# Patient Record
Sex: Female | Born: 1945
Health system: Southern US, Community
[De-identification: ages and names within clinical notes are randomized; demographics above are authoritative.]

## PROBLEM LIST (undated history)

## (undated) DIAGNOSIS — Z87898 Personal history of other specified conditions: Secondary | ICD-10-CM

## (undated) DIAGNOSIS — K219 Gastro-esophageal reflux disease without esophagitis: Secondary | ICD-10-CM

## (undated) DIAGNOSIS — G4730 Sleep apnea, unspecified: Secondary | ICD-10-CM

## (undated) DIAGNOSIS — R519 Headache, unspecified: Secondary | ICD-10-CM

## (undated) DIAGNOSIS — C801 Malignant (primary) neoplasm, unspecified: Secondary | ICD-10-CM

## (undated) DIAGNOSIS — C4491 Basal cell carcinoma of skin, unspecified: Secondary | ICD-10-CM

## (undated) DIAGNOSIS — M1990 Unspecified osteoarthritis, unspecified site: Secondary | ICD-10-CM

## (undated) DIAGNOSIS — R51 Headache: Secondary | ICD-10-CM

## (undated) DIAGNOSIS — H04123 Dry eye syndrome of bilateral lacrimal glands: Secondary | ICD-10-CM

## (undated) HISTORY — PX: MASTECTOMY: SHX3

## (undated) HISTORY — PX: APPENDECTOMY: SHX54

## (undated) HISTORY — PX: TONSILLECTOMY: SUR1361

## (undated) HISTORY — PX: BREAST SURGERY: SHX581

## (undated) HISTORY — PX: CATARACT EXTRACTION, BILATERAL: SHX1313

## (undated) HISTORY — DX: Basal cell carcinoma of skin, unspecified: C44.91

## (undated) HISTORY — PX: RECONSTRUCTION BREAST W/ TRAM FLAP: SUR1079

---

## 1998-02-05 ENCOUNTER — Other Ambulatory Visit: Admission: RE | Admit: 1998-02-05 | Discharge: 1998-02-05 | Payer: Self-pay | Admitting: Gynecology

## 2000-09-17 ENCOUNTER — Ambulatory Visit (HOSPITAL_COMMUNITY): Admission: RE | Admit: 2000-09-17 | Discharge: 2000-09-17 | Payer: Self-pay | Admitting: Internal Medicine

## 2000-09-17 ENCOUNTER — Encounter: Payer: Self-pay | Admitting: Internal Medicine

## 2002-01-04 ENCOUNTER — Other Ambulatory Visit: Admission: RE | Admit: 2002-01-04 | Discharge: 2002-01-04 | Payer: Self-pay | Admitting: Gynecology

## 2002-12-20 ENCOUNTER — Other Ambulatory Visit: Admission: RE | Admit: 2002-12-20 | Discharge: 2002-12-20 | Payer: Self-pay | Admitting: Gynecology

## 2003-01-03 ENCOUNTER — Encounter: Payer: Self-pay | Admitting: Internal Medicine

## 2003-01-03 ENCOUNTER — Encounter: Admission: RE | Admit: 2003-01-03 | Discharge: 2003-01-03 | Payer: Self-pay | Admitting: Internal Medicine

## 2004-01-01 ENCOUNTER — Other Ambulatory Visit: Admission: RE | Admit: 2004-01-01 | Discharge: 2004-01-01 | Payer: Self-pay | Admitting: Gynecology

## 2005-01-22 ENCOUNTER — Other Ambulatory Visit: Admission: RE | Admit: 2005-01-22 | Discharge: 2005-01-22 | Payer: Self-pay | Admitting: Gynecology

## 2006-01-22 ENCOUNTER — Other Ambulatory Visit: Admission: RE | Admit: 2006-01-22 | Discharge: 2006-01-22 | Payer: Self-pay | Admitting: Gynecology

## 2007-01-25 ENCOUNTER — Other Ambulatory Visit: Admission: RE | Admit: 2007-01-25 | Discharge: 2007-01-25 | Payer: Self-pay | Admitting: Gynecology

## 2008-01-31 ENCOUNTER — Other Ambulatory Visit: Admission: RE | Admit: 2008-01-31 | Discharge: 2008-01-31 | Payer: Self-pay | Admitting: Gynecology

## 2012-03-18 DIAGNOSIS — H9209 Otalgia, unspecified ear: Secondary | ICD-10-CM | POA: Diagnosis not present

## 2012-03-18 DIAGNOSIS — M79609 Pain in unspecified limb: Secondary | ICD-10-CM | POA: Diagnosis not present

## 2012-03-18 DIAGNOSIS — R059 Cough, unspecified: Secondary | ICD-10-CM | POA: Diagnosis not present

## 2012-03-18 DIAGNOSIS — R05 Cough: Secondary | ICD-10-CM | POA: Diagnosis not present

## 2012-03-18 DIAGNOSIS — M25569 Pain in unspecified knee: Secondary | ICD-10-CM | POA: Diagnosis not present

## 2012-03-22 DIAGNOSIS — M779 Enthesopathy, unspecified: Secondary | ICD-10-CM | POA: Diagnosis not present

## 2012-03-25 DIAGNOSIS — M7500 Adhesive capsulitis of unspecified shoulder: Secondary | ICD-10-CM | POA: Diagnosis not present

## 2012-03-29 DIAGNOSIS — M7500 Adhesive capsulitis of unspecified shoulder: Secondary | ICD-10-CM | POA: Diagnosis not present

## 2012-04-01 DIAGNOSIS — M7500 Adhesive capsulitis of unspecified shoulder: Secondary | ICD-10-CM | POA: Diagnosis not present

## 2012-04-05 DIAGNOSIS — E7800 Pure hypercholesterolemia, unspecified: Secondary | ICD-10-CM | POA: Diagnosis not present

## 2012-04-05 DIAGNOSIS — M7500 Adhesive capsulitis of unspecified shoulder: Secondary | ICD-10-CM | POA: Diagnosis not present

## 2012-04-05 DIAGNOSIS — Z1331 Encounter for screening for depression: Secondary | ICD-10-CM | POA: Diagnosis not present

## 2012-04-05 DIAGNOSIS — Z79899 Other long term (current) drug therapy: Secondary | ICD-10-CM | POA: Diagnosis not present

## 2012-04-05 DIAGNOSIS — R5383 Other fatigue: Secondary | ICD-10-CM | POA: Diagnosis not present

## 2012-04-05 DIAGNOSIS — R5381 Other malaise: Secondary | ICD-10-CM | POA: Diagnosis not present

## 2012-04-05 DIAGNOSIS — Z23 Encounter for immunization: Secondary | ICD-10-CM | POA: Diagnosis not present

## 2012-04-05 DIAGNOSIS — Z0000 Encounter for general adult medical examination without abnormal findings: Secondary | ICD-10-CM | POA: Diagnosis not present

## 2012-04-05 DIAGNOSIS — G479 Sleep disorder, unspecified: Secondary | ICD-10-CM | POA: Diagnosis not present

## 2012-04-08 DIAGNOSIS — M7500 Adhesive capsulitis of unspecified shoulder: Secondary | ICD-10-CM | POA: Diagnosis not present

## 2012-04-12 DIAGNOSIS — G518 Other disorders of facial nerve: Secondary | ICD-10-CM | POA: Diagnosis not present

## 2012-04-12 DIAGNOSIS — G43719 Chronic migraine without aura, intractable, without status migrainosus: Secondary | ICD-10-CM | POA: Diagnosis not present

## 2012-04-12 DIAGNOSIS — IMO0001 Reserved for inherently not codable concepts without codable children: Secondary | ICD-10-CM | POA: Diagnosis not present

## 2012-04-12 DIAGNOSIS — M542 Cervicalgia: Secondary | ICD-10-CM | POA: Diagnosis not present

## 2012-04-12 DIAGNOSIS — Z79899 Other long term (current) drug therapy: Secondary | ICD-10-CM | POA: Diagnosis not present

## 2012-04-12 DIAGNOSIS — M7500 Adhesive capsulitis of unspecified shoulder: Secondary | ICD-10-CM | POA: Diagnosis not present

## 2012-04-12 DIAGNOSIS — E559 Vitamin D deficiency, unspecified: Secondary | ICD-10-CM | POA: Diagnosis not present

## 2012-04-19 DIAGNOSIS — M779 Enthesopathy, unspecified: Secondary | ICD-10-CM | POA: Diagnosis not present

## 2012-04-20 DIAGNOSIS — M7500 Adhesive capsulitis of unspecified shoulder: Secondary | ICD-10-CM | POA: Diagnosis not present

## 2012-04-22 DIAGNOSIS — R51 Headache: Secondary | ICD-10-CM | POA: Diagnosis not present

## 2012-04-22 DIAGNOSIS — IMO0001 Reserved for inherently not codable concepts without codable children: Secondary | ICD-10-CM | POA: Diagnosis not present

## 2012-04-22 DIAGNOSIS — G518 Other disorders of facial nerve: Secondary | ICD-10-CM | POA: Diagnosis not present

## 2012-04-22 DIAGNOSIS — M7500 Adhesive capsulitis of unspecified shoulder: Secondary | ICD-10-CM | POA: Diagnosis not present

## 2012-04-22 DIAGNOSIS — M542 Cervicalgia: Secondary | ICD-10-CM | POA: Diagnosis not present

## 2012-05-10 DIAGNOSIS — IMO0001 Reserved for inherently not codable concepts without codable children: Secondary | ICD-10-CM | POA: Diagnosis not present

## 2012-05-10 DIAGNOSIS — R51 Headache: Secondary | ICD-10-CM | POA: Diagnosis not present

## 2012-05-10 DIAGNOSIS — M542 Cervicalgia: Secondary | ICD-10-CM | POA: Diagnosis not present

## 2012-05-10 DIAGNOSIS — G518 Other disorders of facial nerve: Secondary | ICD-10-CM | POA: Diagnosis not present

## 2012-05-11 DIAGNOSIS — M7500 Adhesive capsulitis of unspecified shoulder: Secondary | ICD-10-CM | POA: Diagnosis not present

## 2012-05-14 DIAGNOSIS — M7500 Adhesive capsulitis of unspecified shoulder: Secondary | ICD-10-CM | POA: Diagnosis not present

## 2012-05-17 DIAGNOSIS — M779 Enthesopathy, unspecified: Secondary | ICD-10-CM | POA: Diagnosis not present

## 2012-05-17 DIAGNOSIS — G4733 Obstructive sleep apnea (adult) (pediatric): Secondary | ICD-10-CM | POA: Diagnosis not present

## 2012-05-24 DIAGNOSIS — G518 Other disorders of facial nerve: Secondary | ICD-10-CM | POA: Diagnosis not present

## 2012-05-24 DIAGNOSIS — IMO0001 Reserved for inherently not codable concepts without codable children: Secondary | ICD-10-CM | POA: Diagnosis not present

## 2012-05-24 DIAGNOSIS — R51 Headache: Secondary | ICD-10-CM | POA: Diagnosis not present

## 2012-05-24 DIAGNOSIS — M542 Cervicalgia: Secondary | ICD-10-CM | POA: Diagnosis not present

## 2012-06-01 DIAGNOSIS — H2510 Age-related nuclear cataract, unspecified eye: Secondary | ICD-10-CM | POA: Diagnosis not present

## 2012-06-01 DIAGNOSIS — H04129 Dry eye syndrome of unspecified lacrimal gland: Secondary | ICD-10-CM | POA: Diagnosis not present

## 2012-07-05 DIAGNOSIS — G43719 Chronic migraine without aura, intractable, without status migrainosus: Secondary | ICD-10-CM | POA: Diagnosis not present

## 2012-08-09 DIAGNOSIS — M7500 Adhesive capsulitis of unspecified shoulder: Secondary | ICD-10-CM | POA: Diagnosis not present

## 2012-08-12 DIAGNOSIS — M7500 Adhesive capsulitis of unspecified shoulder: Secondary | ICD-10-CM | POA: Diagnosis not present

## 2012-08-16 DIAGNOSIS — M7500 Adhesive capsulitis of unspecified shoulder: Secondary | ICD-10-CM | POA: Diagnosis not present

## 2012-08-18 DIAGNOSIS — G4733 Obstructive sleep apnea (adult) (pediatric): Secondary | ICD-10-CM | POA: Diagnosis not present

## 2012-08-19 DIAGNOSIS — M7500 Adhesive capsulitis of unspecified shoulder: Secondary | ICD-10-CM | POA: Diagnosis not present

## 2012-08-23 DIAGNOSIS — M7500 Adhesive capsulitis of unspecified shoulder: Secondary | ICD-10-CM | POA: Diagnosis not present

## 2012-08-25 DIAGNOSIS — G4733 Obstructive sleep apnea (adult) (pediatric): Secondary | ICD-10-CM | POA: Diagnosis not present

## 2012-08-30 DIAGNOSIS — M7500 Adhesive capsulitis of unspecified shoulder: Secondary | ICD-10-CM | POA: Diagnosis not present

## 2012-09-02 DIAGNOSIS — M7500 Adhesive capsulitis of unspecified shoulder: Secondary | ICD-10-CM | POA: Diagnosis not present

## 2012-09-06 DIAGNOSIS — M7500 Adhesive capsulitis of unspecified shoulder: Secondary | ICD-10-CM | POA: Diagnosis not present

## 2012-09-08 DIAGNOSIS — M779 Enthesopathy, unspecified: Secondary | ICD-10-CM | POA: Diagnosis not present

## 2012-09-09 DIAGNOSIS — M7500 Adhesive capsulitis of unspecified shoulder: Secondary | ICD-10-CM | POA: Diagnosis not present

## 2012-09-14 DIAGNOSIS — M7500 Adhesive capsulitis of unspecified shoulder: Secondary | ICD-10-CM | POA: Diagnosis not present

## 2012-09-16 DIAGNOSIS — M7500 Adhesive capsulitis of unspecified shoulder: Secondary | ICD-10-CM | POA: Diagnosis not present

## 2012-09-20 DIAGNOSIS — L608 Other nail disorders: Secondary | ICD-10-CM | POA: Diagnosis not present

## 2012-09-20 DIAGNOSIS — M7500 Adhesive capsulitis of unspecified shoulder: Secondary | ICD-10-CM | POA: Diagnosis not present

## 2012-09-20 DIAGNOSIS — K130 Diseases of lips: Secondary | ICD-10-CM | POA: Diagnosis not present

## 2012-09-23 DIAGNOSIS — M7500 Adhesive capsulitis of unspecified shoulder: Secondary | ICD-10-CM | POA: Diagnosis not present

## 2012-09-27 DIAGNOSIS — M7500 Adhesive capsulitis of unspecified shoulder: Secondary | ICD-10-CM | POA: Diagnosis not present

## 2012-09-30 DIAGNOSIS — M7500 Adhesive capsulitis of unspecified shoulder: Secondary | ICD-10-CM | POA: Diagnosis not present

## 2012-10-04 DIAGNOSIS — M7500 Adhesive capsulitis of unspecified shoulder: Secondary | ICD-10-CM | POA: Diagnosis not present

## 2012-10-07 DIAGNOSIS — G43719 Chronic migraine without aura, intractable, without status migrainosus: Secondary | ICD-10-CM | POA: Diagnosis not present

## 2012-10-11 DIAGNOSIS — M779 Enthesopathy, unspecified: Secondary | ICD-10-CM | POA: Diagnosis not present

## 2012-10-21 DIAGNOSIS — M7500 Adhesive capsulitis of unspecified shoulder: Secondary | ICD-10-CM | POA: Diagnosis not present

## 2012-10-25 DIAGNOSIS — M7500 Adhesive capsulitis of unspecified shoulder: Secondary | ICD-10-CM | POA: Diagnosis not present

## 2012-11-04 DIAGNOSIS — G4733 Obstructive sleep apnea (adult) (pediatric): Secondary | ICD-10-CM | POA: Diagnosis not present

## 2012-11-08 DIAGNOSIS — R350 Frequency of micturition: Secondary | ICD-10-CM | POA: Diagnosis not present

## 2013-01-31 DIAGNOSIS — R3915 Urgency of urination: Secondary | ICD-10-CM | POA: Diagnosis not present

## 2013-01-31 DIAGNOSIS — R350 Frequency of micturition: Secondary | ICD-10-CM | POA: Diagnosis not present

## 2013-02-07 DIAGNOSIS — Z124 Encounter for screening for malignant neoplasm of cervix: Secondary | ICD-10-CM | POA: Diagnosis not present

## 2013-02-07 DIAGNOSIS — Z01419 Encounter for gynecological examination (general) (routine) without abnormal findings: Secondary | ICD-10-CM | POA: Diagnosis not present

## 2013-02-09 DIAGNOSIS — R3915 Urgency of urination: Secondary | ICD-10-CM | POA: Diagnosis not present

## 2013-02-09 DIAGNOSIS — M62838 Other muscle spasm: Secondary | ICD-10-CM | POA: Diagnosis not present

## 2013-02-09 DIAGNOSIS — R279 Unspecified lack of coordination: Secondary | ICD-10-CM | POA: Diagnosis not present

## 2013-02-09 DIAGNOSIS — G4733 Obstructive sleep apnea (adult) (pediatric): Secondary | ICD-10-CM | POA: Diagnosis not present

## 2013-02-09 DIAGNOSIS — R350 Frequency of micturition: Secondary | ICD-10-CM | POA: Diagnosis not present

## 2013-02-28 DIAGNOSIS — L570 Actinic keratosis: Secondary | ICD-10-CM | POA: Diagnosis not present

## 2013-02-28 DIAGNOSIS — R350 Frequency of micturition: Secondary | ICD-10-CM | POA: Diagnosis not present

## 2013-02-28 DIAGNOSIS — M62838 Other muscle spasm: Secondary | ICD-10-CM | POA: Diagnosis not present

## 2013-02-28 DIAGNOSIS — D239 Other benign neoplasm of skin, unspecified: Secondary | ICD-10-CM | POA: Diagnosis not present

## 2013-02-28 DIAGNOSIS — R279 Unspecified lack of coordination: Secondary | ICD-10-CM | POA: Diagnosis not present

## 2013-02-28 DIAGNOSIS — R3915 Urgency of urination: Secondary | ICD-10-CM | POA: Diagnosis not present

## 2013-03-02 DIAGNOSIS — Z9010 Acquired absence of unspecified breast and nipple: Secondary | ICD-10-CM | POA: Diagnosis not present

## 2013-03-02 DIAGNOSIS — Z853 Personal history of malignant neoplasm of breast: Secondary | ICD-10-CM | POA: Diagnosis not present

## 2013-04-11 DIAGNOSIS — G43719 Chronic migraine without aura, intractable, without status migrainosus: Secondary | ICD-10-CM | POA: Diagnosis not present

## 2013-04-20 DIAGNOSIS — Z23 Encounter for immunization: Secondary | ICD-10-CM | POA: Diagnosis not present

## 2013-04-22 DIAGNOSIS — Z0000 Encounter for general adult medical examination without abnormal findings: Secondary | ICD-10-CM | POA: Diagnosis not present

## 2013-04-22 DIAGNOSIS — E559 Vitamin D deficiency, unspecified: Secondary | ICD-10-CM | POA: Diagnosis not present

## 2013-04-22 DIAGNOSIS — Z79899 Other long term (current) drug therapy: Secondary | ICD-10-CM | POA: Diagnosis not present

## 2013-04-22 DIAGNOSIS — G4733 Obstructive sleep apnea (adult) (pediatric): Secondary | ICD-10-CM | POA: Diagnosis not present

## 2013-04-22 DIAGNOSIS — M899 Disorder of bone, unspecified: Secondary | ICD-10-CM | POA: Diagnosis not present

## 2013-04-22 DIAGNOSIS — G43909 Migraine, unspecified, not intractable, without status migrainosus: Secondary | ICD-10-CM | POA: Diagnosis not present

## 2013-04-22 DIAGNOSIS — Z1331 Encounter for screening for depression: Secondary | ICD-10-CM | POA: Diagnosis not present

## 2013-04-22 DIAGNOSIS — E7800 Pure hypercholesterolemia, unspecified: Secondary | ICD-10-CM | POA: Diagnosis not present

## 2013-05-12 DIAGNOSIS — M899 Disorder of bone, unspecified: Secondary | ICD-10-CM | POA: Diagnosis not present

## 2013-06-14 DIAGNOSIS — H2510 Age-related nuclear cataract, unspecified eye: Secondary | ICD-10-CM | POA: Diagnosis not present

## 2013-09-20 DIAGNOSIS — L570 Actinic keratosis: Secondary | ICD-10-CM | POA: Diagnosis not present

## 2013-09-20 DIAGNOSIS — L821 Other seborrheic keratosis: Secondary | ICD-10-CM | POA: Diagnosis not present

## 2013-09-20 DIAGNOSIS — D485 Neoplasm of uncertain behavior of skin: Secondary | ICD-10-CM | POA: Diagnosis not present

## 2013-10-10 DIAGNOSIS — G43719 Chronic migraine without aura, intractable, without status migrainosus: Secondary | ICD-10-CM | POA: Diagnosis not present

## 2013-11-24 DIAGNOSIS — M949 Disorder of cartilage, unspecified: Secondary | ICD-10-CM | POA: Diagnosis not present

## 2013-11-24 DIAGNOSIS — M899 Disorder of bone, unspecified: Secondary | ICD-10-CM | POA: Diagnosis not present

## 2013-11-24 DIAGNOSIS — R059 Cough, unspecified: Secondary | ICD-10-CM | POA: Diagnosis not present

## 2013-11-24 DIAGNOSIS — R05 Cough: Secondary | ICD-10-CM | POA: Diagnosis not present

## 2013-12-06 DIAGNOSIS — R05 Cough: Secondary | ICD-10-CM | POA: Diagnosis not present

## 2013-12-06 DIAGNOSIS — R059 Cough, unspecified: Secondary | ICD-10-CM | POA: Diagnosis not present

## 2013-12-06 DIAGNOSIS — J370 Chronic laryngitis: Secondary | ICD-10-CM | POA: Diagnosis not present

## 2013-12-06 DIAGNOSIS — J309 Allergic rhinitis, unspecified: Secondary | ICD-10-CM | POA: Diagnosis not present

## 2013-12-06 DIAGNOSIS — J45909 Unspecified asthma, uncomplicated: Secondary | ICD-10-CM | POA: Diagnosis not present

## 2013-12-13 DIAGNOSIS — R350 Frequency of micturition: Secondary | ICD-10-CM | POA: Diagnosis not present

## 2014-01-02 DIAGNOSIS — R350 Frequency of micturition: Secondary | ICD-10-CM | POA: Diagnosis not present

## 2014-01-02 DIAGNOSIS — R3915 Urgency of urination: Secondary | ICD-10-CM | POA: Diagnosis not present

## 2014-01-09 DIAGNOSIS — R3915 Urgency of urination: Secondary | ICD-10-CM | POA: Diagnosis not present

## 2014-01-09 DIAGNOSIS — R350 Frequency of micturition: Secondary | ICD-10-CM | POA: Diagnosis not present

## 2014-01-16 DIAGNOSIS — R3915 Urgency of urination: Secondary | ICD-10-CM | POA: Diagnosis not present

## 2014-01-23 DIAGNOSIS — R3915 Urgency of urination: Secondary | ICD-10-CM | POA: Diagnosis not present

## 2014-02-06 DIAGNOSIS — R3915 Urgency of urination: Secondary | ICD-10-CM | POA: Diagnosis not present

## 2014-02-08 DIAGNOSIS — Z01419 Encounter for gynecological examination (general) (routine) without abnormal findings: Secondary | ICD-10-CM | POA: Diagnosis not present

## 2014-02-08 DIAGNOSIS — Z1212 Encounter for screening for malignant neoplasm of rectum: Secondary | ICD-10-CM | POA: Diagnosis not present

## 2014-02-13 DIAGNOSIS — R3915 Urgency of urination: Secondary | ICD-10-CM | POA: Diagnosis not present

## 2014-02-13 DIAGNOSIS — R350 Frequency of micturition: Secondary | ICD-10-CM | POA: Diagnosis not present

## 2014-02-20 DIAGNOSIS — R3915 Urgency of urination: Secondary | ICD-10-CM | POA: Diagnosis not present

## 2014-02-27 DIAGNOSIS — R350 Frequency of micturition: Secondary | ICD-10-CM | POA: Diagnosis not present

## 2014-02-27 DIAGNOSIS — R3915 Urgency of urination: Secondary | ICD-10-CM | POA: Diagnosis not present

## 2014-03-06 DIAGNOSIS — R3915 Urgency of urination: Secondary | ICD-10-CM | POA: Diagnosis not present

## 2014-03-06 DIAGNOSIS — R350 Frequency of micturition: Secondary | ICD-10-CM | POA: Diagnosis not present

## 2014-03-07 DIAGNOSIS — J370 Chronic laryngitis: Secondary | ICD-10-CM | POA: Diagnosis not present

## 2014-03-07 DIAGNOSIS — J309 Allergic rhinitis, unspecified: Secondary | ICD-10-CM | POA: Diagnosis not present

## 2014-03-07 DIAGNOSIS — J45909 Unspecified asthma, uncomplicated: Secondary | ICD-10-CM | POA: Diagnosis not present

## 2014-03-08 DIAGNOSIS — Z853 Personal history of malignant neoplasm of breast: Secondary | ICD-10-CM | POA: Diagnosis not present

## 2014-03-08 DIAGNOSIS — Z1231 Encounter for screening mammogram for malignant neoplasm of breast: Secondary | ICD-10-CM | POA: Diagnosis not present

## 2014-03-14 DIAGNOSIS — R350 Frequency of micturition: Secondary | ICD-10-CM | POA: Diagnosis not present

## 2014-03-14 DIAGNOSIS — R3915 Urgency of urination: Secondary | ICD-10-CM | POA: Diagnosis not present

## 2014-03-21 DIAGNOSIS — R3915 Urgency of urination: Secondary | ICD-10-CM | POA: Diagnosis not present

## 2014-03-21 DIAGNOSIS — R350 Frequency of micturition: Secondary | ICD-10-CM | POA: Diagnosis not present

## 2014-03-24 DIAGNOSIS — G43719 Chronic migraine without aura, intractable, without status migrainosus: Secondary | ICD-10-CM | POA: Diagnosis not present

## 2014-03-24 DIAGNOSIS — Z79899 Other long term (current) drug therapy: Secondary | ICD-10-CM | POA: Diagnosis not present

## 2014-03-27 DIAGNOSIS — R3915 Urgency of urination: Secondary | ICD-10-CM | POA: Diagnosis not present

## 2014-03-27 DIAGNOSIS — R350 Frequency of micturition: Secondary | ICD-10-CM | POA: Diagnosis not present

## 2014-04-21 DIAGNOSIS — Z23 Encounter for immunization: Secondary | ICD-10-CM | POA: Diagnosis not present

## 2014-04-24 DIAGNOSIS — R350 Frequency of micturition: Secondary | ICD-10-CM | POA: Diagnosis not present

## 2014-04-24 DIAGNOSIS — R3915 Urgency of urination: Secondary | ICD-10-CM | POA: Diagnosis not present

## 2014-05-15 DIAGNOSIS — R3915 Urgency of urination: Secondary | ICD-10-CM | POA: Diagnosis not present

## 2014-05-22 DIAGNOSIS — Z23 Encounter for immunization: Secondary | ICD-10-CM | POA: Diagnosis not present

## 2014-05-22 DIAGNOSIS — G4733 Obstructive sleep apnea (adult) (pediatric): Secondary | ICD-10-CM | POA: Diagnosis not present

## 2014-05-22 DIAGNOSIS — E559 Vitamin D deficiency, unspecified: Secondary | ICD-10-CM | POA: Diagnosis not present

## 2014-05-22 DIAGNOSIS — Z1389 Encounter for screening for other disorder: Secondary | ICD-10-CM | POA: Diagnosis not present

## 2014-05-22 DIAGNOSIS — Z0000 Encounter for general adult medical examination without abnormal findings: Secondary | ICD-10-CM | POA: Diagnosis not present

## 2014-05-22 DIAGNOSIS — M8580 Other specified disorders of bone density and structure, unspecified site: Secondary | ICD-10-CM | POA: Diagnosis not present

## 2014-05-22 DIAGNOSIS — E780 Pure hypercholesterolemia: Secondary | ICD-10-CM | POA: Diagnosis not present

## 2014-06-05 DIAGNOSIS — R3915 Urgency of urination: Secondary | ICD-10-CM | POA: Diagnosis not present

## 2014-06-16 DIAGNOSIS — H2511 Age-related nuclear cataract, right eye: Secondary | ICD-10-CM | POA: Diagnosis not present

## 2014-06-16 DIAGNOSIS — H02839 Dermatochalasis of unspecified eye, unspecified eyelid: Secondary | ICD-10-CM | POA: Diagnosis not present

## 2014-06-16 DIAGNOSIS — H2512 Age-related nuclear cataract, left eye: Secondary | ICD-10-CM | POA: Diagnosis not present

## 2014-06-16 DIAGNOSIS — H04123 Dry eye syndrome of bilateral lacrimal glands: Secondary | ICD-10-CM | POA: Diagnosis not present

## 2014-06-16 DIAGNOSIS — H3531 Nonexudative age-related macular degeneration: Secondary | ICD-10-CM | POA: Diagnosis not present

## 2014-06-28 DIAGNOSIS — R350 Frequency of micturition: Secondary | ICD-10-CM | POA: Diagnosis not present

## 2014-06-28 DIAGNOSIS — R3915 Urgency of urination: Secondary | ICD-10-CM | POA: Diagnosis not present

## 2014-07-26 DIAGNOSIS — R350 Frequency of micturition: Secondary | ICD-10-CM | POA: Diagnosis not present

## 2014-07-26 DIAGNOSIS — R3915 Urgency of urination: Secondary | ICD-10-CM | POA: Diagnosis not present

## 2014-08-11 DIAGNOSIS — J309 Allergic rhinitis, unspecified: Secondary | ICD-10-CM | POA: Diagnosis not present

## 2014-08-11 DIAGNOSIS — R05 Cough: Secondary | ICD-10-CM | POA: Diagnosis not present

## 2014-08-11 DIAGNOSIS — J370 Chronic laryngitis: Secondary | ICD-10-CM | POA: Diagnosis not present

## 2014-08-16 DIAGNOSIS — R3915 Urgency of urination: Secondary | ICD-10-CM | POA: Diagnosis not present

## 2014-08-16 DIAGNOSIS — R350 Frequency of micturition: Secondary | ICD-10-CM | POA: Diagnosis not present

## 2014-08-21 DIAGNOSIS — H25811 Combined forms of age-related cataract, right eye: Secondary | ICD-10-CM | POA: Diagnosis not present

## 2014-08-21 DIAGNOSIS — H2511 Age-related nuclear cataract, right eye: Secondary | ICD-10-CM | POA: Diagnosis not present

## 2014-08-22 DIAGNOSIS — H2512 Age-related nuclear cataract, left eye: Secondary | ICD-10-CM | POA: Diagnosis not present

## 2014-09-11 DIAGNOSIS — H2512 Age-related nuclear cataract, left eye: Secondary | ICD-10-CM | POA: Diagnosis not present

## 2014-09-11 DIAGNOSIS — H25812 Combined forms of age-related cataract, left eye: Secondary | ICD-10-CM | POA: Diagnosis not present

## 2014-09-13 DIAGNOSIS — R3915 Urgency of urination: Secondary | ICD-10-CM | POA: Diagnosis not present

## 2014-09-21 DIAGNOSIS — G43719 Chronic migraine without aura, intractable, without status migrainosus: Secondary | ICD-10-CM | POA: Diagnosis not present

## 2014-10-11 DIAGNOSIS — R3915 Urgency of urination: Secondary | ICD-10-CM | POA: Diagnosis not present

## 2014-10-11 DIAGNOSIS — R350 Frequency of micturition: Secondary | ICD-10-CM | POA: Diagnosis not present

## 2014-10-11 DIAGNOSIS — D239 Other benign neoplasm of skin, unspecified: Secondary | ICD-10-CM | POA: Diagnosis not present

## 2014-10-11 DIAGNOSIS — L821 Other seborrheic keratosis: Secondary | ICD-10-CM | POA: Diagnosis not present

## 2014-11-01 DIAGNOSIS — R350 Frequency of micturition: Secondary | ICD-10-CM | POA: Diagnosis not present

## 2014-11-01 DIAGNOSIS — R3915 Urgency of urination: Secondary | ICD-10-CM | POA: Diagnosis not present

## 2014-11-22 DIAGNOSIS — R350 Frequency of micturition: Secondary | ICD-10-CM | POA: Diagnosis not present

## 2014-11-22 DIAGNOSIS — R3915 Urgency of urination: Secondary | ICD-10-CM | POA: Diagnosis not present

## 2014-12-13 DIAGNOSIS — R350 Frequency of micturition: Secondary | ICD-10-CM | POA: Diagnosis not present

## 2014-12-13 DIAGNOSIS — R3915 Urgency of urination: Secondary | ICD-10-CM | POA: Diagnosis not present

## 2015-01-03 DIAGNOSIS — R3915 Urgency of urination: Secondary | ICD-10-CM | POA: Diagnosis not present

## 2015-01-03 DIAGNOSIS — R350 Frequency of micturition: Secondary | ICD-10-CM | POA: Diagnosis not present

## 2015-01-05 DIAGNOSIS — R350 Frequency of micturition: Secondary | ICD-10-CM | POA: Diagnosis not present

## 2015-01-29 DIAGNOSIS — R42 Dizziness and giddiness: Secondary | ICD-10-CM | POA: Diagnosis not present

## 2015-01-29 DIAGNOSIS — K219 Gastro-esophageal reflux disease without esophagitis: Secondary | ICD-10-CM | POA: Diagnosis not present

## 2015-01-31 DIAGNOSIS — R3915 Urgency of urination: Secondary | ICD-10-CM | POA: Diagnosis not present

## 2015-02-26 DIAGNOSIS — Z6824 Body mass index (BMI) 24.0-24.9, adult: Secondary | ICD-10-CM | POA: Diagnosis not present

## 2015-02-26 DIAGNOSIS — Z124 Encounter for screening for malignant neoplasm of cervix: Secondary | ICD-10-CM | POA: Diagnosis not present

## 2015-02-26 DIAGNOSIS — Z1212 Encounter for screening for malignant neoplasm of rectum: Secondary | ICD-10-CM | POA: Diagnosis not present

## 2015-02-27 DIAGNOSIS — R3915 Urgency of urination: Secondary | ICD-10-CM | POA: Diagnosis not present

## 2015-02-27 DIAGNOSIS — R350 Frequency of micturition: Secondary | ICD-10-CM | POA: Diagnosis not present

## 2015-03-12 DIAGNOSIS — Z1231 Encounter for screening mammogram for malignant neoplasm of breast: Secondary | ICD-10-CM | POA: Diagnosis not present

## 2015-03-21 DIAGNOSIS — R350 Frequency of micturition: Secondary | ICD-10-CM | POA: Diagnosis not present

## 2015-03-21 DIAGNOSIS — R3915 Urgency of urination: Secondary | ICD-10-CM | POA: Diagnosis not present

## 2015-04-09 DIAGNOSIS — Z79899 Other long term (current) drug therapy: Secondary | ICD-10-CM | POA: Diagnosis not present

## 2015-04-09 DIAGNOSIS — R51 Headache: Secondary | ICD-10-CM | POA: Diagnosis not present

## 2015-04-09 DIAGNOSIS — G43719 Chronic migraine without aura, intractable, without status migrainosus: Secondary | ICD-10-CM | POA: Diagnosis not present

## 2015-04-11 DIAGNOSIS — R350 Frequency of micturition: Secondary | ICD-10-CM | POA: Diagnosis not present

## 2015-04-11 DIAGNOSIS — R3915 Urgency of urination: Secondary | ICD-10-CM | POA: Diagnosis not present

## 2015-05-01 DIAGNOSIS — Z23 Encounter for immunization: Secondary | ICD-10-CM | POA: Diagnosis not present

## 2015-05-02 DIAGNOSIS — R350 Frequency of micturition: Secondary | ICD-10-CM | POA: Diagnosis not present

## 2015-05-02 DIAGNOSIS — R3915 Urgency of urination: Secondary | ICD-10-CM | POA: Diagnosis not present

## 2015-05-08 DIAGNOSIS — H353133 Nonexudative age-related macular degeneration, bilateral, advanced atrophic without subfoveal involvement: Secondary | ICD-10-CM | POA: Diagnosis not present

## 2015-05-08 DIAGNOSIS — H18413 Arcus senilis, bilateral: Secondary | ICD-10-CM | POA: Diagnosis not present

## 2015-05-08 DIAGNOSIS — H04123 Dry eye syndrome of bilateral lacrimal glands: Secondary | ICD-10-CM | POA: Diagnosis not present

## 2015-05-08 DIAGNOSIS — H02839 Dermatochalasis of unspecified eye, unspecified eyelid: Secondary | ICD-10-CM | POA: Diagnosis not present

## 2015-05-23 DIAGNOSIS — R3915 Urgency of urination: Secondary | ICD-10-CM | POA: Diagnosis not present

## 2015-05-28 ENCOUNTER — Ambulatory Visit
Admission: RE | Admit: 2015-05-28 | Discharge: 2015-05-28 | Disposition: A | Discharge status: Home / Self Care | Payer: Medicare Other | Source: Ambulatory Visit | Attending: Geriatric Medicine | Admitting: Geriatric Medicine

## 2015-05-28 ENCOUNTER — Other Ambulatory Visit: Payer: Self-pay | Admitting: Geriatric Medicine

## 2015-05-28 DIAGNOSIS — Z1389 Encounter for screening for other disorder: Secondary | ICD-10-CM | POA: Diagnosis not present

## 2015-05-28 DIAGNOSIS — R05 Cough: Secondary | ICD-10-CM

## 2015-05-28 DIAGNOSIS — G4700 Insomnia, unspecified: Secondary | ICD-10-CM | POA: Diagnosis not present

## 2015-05-28 DIAGNOSIS — J309 Allergic rhinitis, unspecified: Secondary | ICD-10-CM | POA: Diagnosis not present

## 2015-05-28 DIAGNOSIS — K219 Gastro-esophageal reflux disease without esophagitis: Secondary | ICD-10-CM | POA: Diagnosis not present

## 2015-05-28 DIAGNOSIS — R059 Cough, unspecified: Secondary | ICD-10-CM

## 2015-05-28 DIAGNOSIS — R300 Dysuria: Secondary | ICD-10-CM | POA: Diagnosis not present

## 2015-05-28 DIAGNOSIS — Z0000 Encounter for general adult medical examination without abnormal findings: Secondary | ICD-10-CM | POA: Diagnosis not present

## 2015-05-28 DIAGNOSIS — R197 Diarrhea, unspecified: Secondary | ICD-10-CM | POA: Diagnosis not present

## 2015-06-05 DIAGNOSIS — G4733 Obstructive sleep apnea (adult) (pediatric): Secondary | ICD-10-CM | POA: Diagnosis not present

## 2015-06-19 DIAGNOSIS — R3915 Urgency of urination: Secondary | ICD-10-CM | POA: Diagnosis not present

## 2015-07-17 DIAGNOSIS — R350 Frequency of micturition: Secondary | ICD-10-CM | POA: Diagnosis not present

## 2015-07-17 DIAGNOSIS — R3915 Urgency of urination: Secondary | ICD-10-CM | POA: Diagnosis not present

## 2015-08-07 DIAGNOSIS — R350 Frequency of micturition: Secondary | ICD-10-CM | POA: Diagnosis not present

## 2015-08-07 DIAGNOSIS — R3915 Urgency of urination: Secondary | ICD-10-CM | POA: Diagnosis not present

## 2015-09-04 DIAGNOSIS — R350 Frequency of micturition: Secondary | ICD-10-CM | POA: Diagnosis not present

## 2015-09-04 DIAGNOSIS — R3915 Urgency of urination: Secondary | ICD-10-CM | POA: Diagnosis not present

## 2015-09-27 DIAGNOSIS — R3915 Urgency of urination: Secondary | ICD-10-CM | POA: Diagnosis not present

## 2015-09-27 DIAGNOSIS — R350 Frequency of micturition: Secondary | ICD-10-CM | POA: Diagnosis not present

## 2015-10-16 DIAGNOSIS — G43019 Migraine without aura, intractable, without status migrainosus: Secondary | ICD-10-CM | POA: Diagnosis not present

## 2015-10-16 DIAGNOSIS — G43719 Chronic migraine without aura, intractable, without status migrainosus: Secondary | ICD-10-CM | POA: Diagnosis not present

## 2015-10-18 DIAGNOSIS — R3915 Urgency of urination: Secondary | ICD-10-CM | POA: Diagnosis not present

## 2015-10-23 DIAGNOSIS — F458 Other somatoform disorders: Secondary | ICD-10-CM | POA: Diagnosis not present

## 2015-10-23 DIAGNOSIS — L570 Actinic keratosis: Secondary | ICD-10-CM | POA: Diagnosis not present

## 2015-10-23 DIAGNOSIS — D239 Other benign neoplasm of skin, unspecified: Secondary | ICD-10-CM | POA: Diagnosis not present

## 2015-10-23 DIAGNOSIS — L219 Seborrheic dermatitis, unspecified: Secondary | ICD-10-CM | POA: Diagnosis not present

## 2015-11-08 DIAGNOSIS — R3915 Urgency of urination: Secondary | ICD-10-CM | POA: Diagnosis not present

## 2015-11-08 DIAGNOSIS — R350 Frequency of micturition: Secondary | ICD-10-CM | POA: Diagnosis not present

## 2015-11-27 DIAGNOSIS — M8588 Other specified disorders of bone density and structure, other site: Secondary | ICD-10-CM | POA: Diagnosis not present

## 2015-11-27 DIAGNOSIS — R05 Cough: Secondary | ICD-10-CM | POA: Diagnosis not present

## 2015-11-27 DIAGNOSIS — G4733 Obstructive sleep apnea (adult) (pediatric): Secondary | ICD-10-CM | POA: Diagnosis not present

## 2015-11-27 DIAGNOSIS — J309 Allergic rhinitis, unspecified: Secondary | ICD-10-CM | POA: Diagnosis not present

## 2015-11-27 DIAGNOSIS — K219 Gastro-esophageal reflux disease without esophagitis: Secondary | ICD-10-CM | POA: Diagnosis not present

## 2015-11-29 DIAGNOSIS — R3915 Urgency of urination: Secondary | ICD-10-CM | POA: Diagnosis not present

## 2015-11-29 DIAGNOSIS — R350 Frequency of micturition: Secondary | ICD-10-CM | POA: Diagnosis not present

## 2015-12-20 DIAGNOSIS — R350 Frequency of micturition: Secondary | ICD-10-CM | POA: Diagnosis not present

## 2015-12-20 DIAGNOSIS — R3915 Urgency of urination: Secondary | ICD-10-CM | POA: Diagnosis not present

## 2015-12-25 DIAGNOSIS — M8588 Other specified disorders of bone density and structure, other site: Secondary | ICD-10-CM | POA: Diagnosis not present

## 2016-01-04 DIAGNOSIS — R05 Cough: Secondary | ICD-10-CM | POA: Diagnosis not present

## 2016-01-04 DIAGNOSIS — M8580 Other specified disorders of bone density and structure, unspecified site: Secondary | ICD-10-CM | POA: Diagnosis not present

## 2016-01-07 DIAGNOSIS — R350 Frequency of micturition: Secondary | ICD-10-CM | POA: Diagnosis not present

## 2016-03-12 DIAGNOSIS — Z853 Personal history of malignant neoplasm of breast: Secondary | ICD-10-CM | POA: Diagnosis not present

## 2016-03-12 DIAGNOSIS — Z1231 Encounter for screening mammogram for malignant neoplasm of breast: Secondary | ICD-10-CM | POA: Diagnosis not present

## 2016-03-21 DIAGNOSIS — R1013 Epigastric pain: Secondary | ICD-10-CM | POA: Diagnosis not present

## 2016-03-21 DIAGNOSIS — R194 Change in bowel habit: Secondary | ICD-10-CM | POA: Diagnosis not present

## 2016-03-27 ENCOUNTER — Other Ambulatory Visit: Payer: Self-pay | Admitting: Gastroenterology

## 2016-03-27 DIAGNOSIS — Z23 Encounter for immunization: Secondary | ICD-10-CM | POA: Diagnosis not present

## 2016-03-27 DIAGNOSIS — Z1211 Encounter for screening for malignant neoplasm of colon: Secondary | ICD-10-CM | POA: Diagnosis not present

## 2016-04-15 DIAGNOSIS — G43719 Chronic migraine without aura, intractable, without status migrainosus: Secondary | ICD-10-CM | POA: Diagnosis not present

## 2016-04-15 DIAGNOSIS — R51 Headache: Secondary | ICD-10-CM | POA: Diagnosis not present

## 2016-04-15 DIAGNOSIS — Z79899 Other long term (current) drug therapy: Secondary | ICD-10-CM | POA: Diagnosis not present

## 2016-04-15 DIAGNOSIS — G43019 Migraine without aura, intractable, without status migrainosus: Secondary | ICD-10-CM | POA: Diagnosis not present

## 2016-04-22 DIAGNOSIS — R109 Unspecified abdominal pain: Secondary | ICD-10-CM | POA: Diagnosis not present

## 2016-04-22 DIAGNOSIS — J309 Allergic rhinitis, unspecified: Secondary | ICD-10-CM | POA: Diagnosis not present

## 2016-05-12 ENCOUNTER — Encounter (HOSPITAL_COMMUNITY): Payer: Self-pay | Admitting: *Deleted

## 2016-05-13 ENCOUNTER — Ambulatory Visit (HOSPITAL_COMMUNITY): Payer: Medicare Other | Admitting: Registered Nurse

## 2016-05-13 ENCOUNTER — Ambulatory Visit (HOSPITAL_COMMUNITY)
Admission: RE | Admit: 2016-05-13 | Discharge: 2016-05-13 | Disposition: A | Discharge status: Home / Self Care | Payer: Medicare Other | Source: Ambulatory Visit | Attending: Gastroenterology | Admitting: Gastroenterology

## 2016-05-13 ENCOUNTER — Encounter (HOSPITAL_COMMUNITY)
Admission: RE | Disposition: A | Discharge status: Home / Self Care | Payer: Self-pay | Source: Ambulatory Visit | Attending: Gastroenterology

## 2016-05-13 ENCOUNTER — Encounter (HOSPITAL_COMMUNITY): Payer: Self-pay | Admitting: *Deleted

## 2016-05-13 DIAGNOSIS — K562 Volvulus: Secondary | ICD-10-CM | POA: Insufficient documentation

## 2016-05-13 DIAGNOSIS — G4733 Obstructive sleep apnea (adult) (pediatric): Secondary | ICD-10-CM | POA: Insufficient documentation

## 2016-05-13 DIAGNOSIS — Z1211 Encounter for screening for malignant neoplasm of colon: Secondary | ICD-10-CM | POA: Diagnosis not present

## 2016-05-13 DIAGNOSIS — K6389 Other specified diseases of intestine: Secondary | ICD-10-CM | POA: Diagnosis not present

## 2016-05-13 DIAGNOSIS — G4730 Sleep apnea, unspecified: Secondary | ICD-10-CM | POA: Diagnosis not present

## 2016-05-13 DIAGNOSIS — E7800 Pure hypercholesterolemia, unspecified: Secondary | ICD-10-CM | POA: Insufficient documentation

## 2016-05-13 DIAGNOSIS — K219 Gastro-esophageal reflux disease without esophagitis: Secondary | ICD-10-CM | POA: Insufficient documentation

## 2016-05-13 DIAGNOSIS — Z79899 Other long term (current) drug therapy: Secondary | ICD-10-CM | POA: Diagnosis not present

## 2016-05-13 DIAGNOSIS — M1990 Unspecified osteoarthritis, unspecified site: Secondary | ICD-10-CM | POA: Diagnosis not present

## 2016-05-13 HISTORY — DX: Headache: R51

## 2016-05-13 HISTORY — DX: Gastro-esophageal reflux disease without esophagitis: K21.9

## 2016-05-13 HISTORY — DX: Sleep apnea, unspecified: G47.30

## 2016-05-13 HISTORY — DX: Malignant (primary) neoplasm, unspecified: C80.1

## 2016-05-13 HISTORY — DX: Personal history of other specified conditions: Z87.898

## 2016-05-13 HISTORY — PX: COLONOSCOPY WITH PROPOFOL: SHX5780

## 2016-05-13 HISTORY — DX: Unspecified osteoarthritis, unspecified site: M19.90

## 2016-05-13 HISTORY — DX: Headache, unspecified: R51.9

## 2016-05-13 HISTORY — DX: Dry eye syndrome of bilateral lacrimal glands: H04.123

## 2016-05-13 SURGERY — COLONOSCOPY WITH PROPOFOL
Anesthesia: Monitor Anesthesia Care

## 2016-05-13 MED ORDER — PROPOFOL 500 MG/50ML IV EMUL
INTRAVENOUS | Status: DC | PRN
  Administered 2016-05-13: 130 ug/kg/min via INTRAVENOUS

## 2016-05-13 MED ORDER — SODIUM CHLORIDE 0.9 % IV SOLN: INTRAVENOUS | Status: DC

## 2016-05-13 MED ORDER — LIDOCAINE 2% (20 MG/ML) 5 ML SYRINGE
INTRAMUSCULAR | Status: AC
  Filled 2016-05-13: qty 5

## 2016-05-13 MED ORDER — LIDOCAINE 2% (20 MG/ML) 5 ML SYRINGE
INTRAMUSCULAR | Status: DC | PRN
  Administered 2016-05-13: 100 mg via INTRAVENOUS

## 2016-05-13 MED ORDER — PROPOFOL 10 MG/ML IV BOLUS
INTRAVENOUS | Status: DC | PRN
  Administered 2016-05-13 (×3): 20 mg via INTRAVENOUS

## 2016-05-13 MED ORDER — LACTATED RINGERS IV SOLN
INTRAVENOUS | Status: DC
  Administered 2016-05-13: 10:00:00 via INTRAVENOUS

## 2016-05-13 MED ORDER — PROPOFOL 10 MG/ML IV BOLUS
INTRAVENOUS | Status: AC
  Filled 2016-05-13: qty 40

## 2016-05-13 SURGICAL SUPPLY — 22 items

## 2016-05-13 NOTE — Op Note (Signed)
[facility] Patient Name: Sara Le Procedure Date: 05/13/2016 MRN: [id number] Attending MD: Garlan Fair , MD Date of Birth: 1946/03/02 CSN: [id number] Age: 70 Admit Type: Outpatient Procedure:                Colonoscopy Indications:              Screening for colorectal malignant neoplasm and                            microscopic colitis.Chronic intermittant abdominal                            cramping discomfort associated with watery                            diarrhea. 02/23/2006 normal screening colonoscopy                            was performed. Providers:                Garlan Fair, MD, Hilma Favors, RN, [facility], Technician, Courtney Heys. [place], CRNA Referring MD:              Medicines:                Propofol per Anesthesia Complications:            No immediate complications. Estimated Blood Loss:     Estimated blood loss: none. Procedure:                Pre-Anesthesia Assessment:                           - Prior to the procedure, a History and Physical                            was performed, and patient medications and                            allergies were reviewed. The patient's tolerance of                            previous anesthesia was also reviewed. The risks                            and benefits of the procedure and the sedation                            options and risks were discussed with the patient.                            All questions were answered, and informed consent                            was obtained. Prior Anticoagulants: The  patient has                            taken no previous anticoagulant or antiplatelet                            agents. ASA Grade Assessment: II - A patient with                            mild systemic disease. After reviewing the risks                            and benefits, the patient was deemed in   satisfactory condition to undergo the procedure.                           After obtaining informed consent, the colonoscope                            was passed under direct vision. Throughout the                            procedure, the patient's blood pressure, pulse, and                            oxygen saturations were monitored continuously. The                            [id number] scope was introduced through                            the anus and advanced to the the cecum, identified                            by appendiceal orifice and ileocecal valve. The                            colonoscopy was somewhat difficult due to                            significant looping. The patient tolerated the                            procedure well. The quality of the bowel                            preparation was good. The appendiceal orifice and                            the rectum were photographed. Findings:      The perianal and digital rectal examinations were normal.      The entire examined colon appeared normal.      Biopsies for histology were taken with a cold forceps from the ascending       colon and descending colon  for evaluation of microscopic colitis. Impression:               - The entire examined colon is normal.                           - Biopsies were taken with a cold forceps from the                            ascending colon and descending colon for evaluation                            of microscopic colitis. Moderate Sedation:      N/A- Per Anesthesia Care Recommendation:           - Patient has a contact number available for                            emergencies. The signs and symptoms of potential                            delayed complications were discussed with the                            patient. Return to normal activities tomorrow.                            Written discharge instructions were provided to the                             patient.                           - Repeat colonoscopy is not recommended for                            screening purposes.                           - Resume previous diet.                           - Continue present medications. Procedure Code(s):        --- Professional ---                           [phone], Colonoscopy, flexible; with biopsy, single                            or multiple Diagnosis Code(s):        --- Professional ---                           Z12.11, Encounter for screening for malignant                            neoplasm of colon CPT copyright 2016 [employer]. All rights reserved. The codes  documented in this report are preliminary and upon coder review may  be revised to meet current compliance requirements. Earle Gell, MD Garlan Fair, MD 05/13/2016 10:14:52 AM This report has been signed electronically. Number of Addenda: 0

## 2016-05-13 NOTE — Anesthesia Preprocedure Evaluation (Addendum)
Anesthesia Evaluation  Patient identified by MRN, date of birth, ID band Patient awake    Reviewed: Allergy & Precautions, NPO status , Patient's Chart, lab work & pertinent test results  History of Anesthesia Complications Negative for: history of anesthetic complications  Airway Mallampati: IV  TM Distance: >3 FB Neck ROM: Full    Dental  (+) Dental Advisory Given   Pulmonary sleep apnea and Continuous Positive Airway Pressure Ventilation ,    breath sounds clear to auscultation       Cardiovascular (-) anginanegative cardio ROS   Rhythm:Regular Rate:Normal     Neuro/Psych negative neurological ROS     GI/Hepatic Neg liver ROS, GERD  Medicated and Controlled,  Endo/Other  negative endocrine ROS  Renal/GU negative Renal ROS     Musculoskeletal  (+) Arthritis , Osteoarthritis,    Abdominal   Peds  Hematology negative hematology ROS (+)   Anesthesia Other Findings Breast cancer  Reproductive/Obstetrics                            Anesthesia Physical Anesthesia Plan  ASA: II  Anesthesia Plan: MAC   Post-op Pain Management:    Induction: Intravenous  Airway Management Planned: Natural Airway  Additional Equipment:   Intra-op Plan:   Post-operative Plan:   Informed Consent: I have reviewed the patients History and Physical, chart, labs and discussed the procedure including the risks, benefits and alternatives for the proposed anesthesia with the patient or authorized representative who has indicated his/her understanding and acceptance.   Dental advisory given  Plan Discussed with: CRNA and Surgeon  Anesthesia Plan Comments: (Plan routine monitors, MAC)        Anesthesia Quick Evaluation

## 2016-05-13 NOTE — Anesthesia Postprocedure Evaluation (Signed)
Anesthesia Post Note  Patient: Sara Le  Procedure(s) Performed: Procedure(s) (LRB): COLONOSCOPY WITH PROPOFOL (N/A)  Patient location during evaluation: Endoscopy Anesthesia Type: MAC Level of consciousness: awake and alert, oriented and patient cooperative Pain management: pain level controlled Vital Signs Assessment: post-procedure vital signs reviewed and stable Respiratory status: spontaneous breathing, nonlabored ventilation and respiratory function stable Cardiovascular status: blood pressure returned to baseline and stable Postop Assessment: no signs of nausea or vomiting Anesthetic complications: no    Last Vitals:  Vitals:   05/13/16 1035 05/13/16 1040  BP:  123/70  Pulse: 66 71  Resp: 12 17  Temp:      Last Pain:  Vitals:   05/13/16 0918  TempSrc: Oral                 [name],E. Sebastion Jun

## 2016-05-13 NOTE — H&P (Signed)
Procedure: Screening colonoscopy. 02/23/2006 normal screening colonoscopy was performed. Chronic, intermittent abdominal cramps with watery diarrhea.  History: The patient is a 70 year old female born 10-05-1945. She is scheduled to undergo a repeat screening colonoscopy today.  She reports chronic, intermittent crampy abdominal pain associated with watery diarrhea. Her symptoms have worsened recently. She was instructed to stop taking omeprazole and Singulair by her primary care physician. She does take magnesium oxide 400 mg daily to prevent migraine headaches.  I will screen her colon for microscopic colitis.  Past medical history: Osteoarthritis. Ductal carcinoma in situ. Migraine headache syndrome. Hypercholesterolemia. Obstructive sleep apnea syndrome. Mastectomy. Tonsillectomy. Appendectomy. D&C.  Allergies: Adhesive tape  Exam: The patient is alert and lying comfortably on the endoscopy stretcher. Abdomen is soft and nontender to palpation. Lungs are clear to auscultation. Cardiac exam reveals a regular rhythm.  Plan: Proceed with screening colonoscopy and screen for microscopic colitis

## 2016-05-13 NOTE — Discharge Instructions (Signed)

## 2016-05-13 NOTE — Transfer of Care (Signed)
Immediate Anesthesia Transfer of Care Note  Patient: Sara Le  Procedure(s) Performed: Procedure(s): COLONOSCOPY WITH PROPOFOL (N/A)  Patient Location: PACU and Endoscopy Unit  Anesthesia Type:MAC  Level of Consciousness: awake, alert , oriented and patient cooperative  Airway & Oxygen Therapy: Patient Spontanous Breathing and Patient connected to face mask oxygen  Post-op Assessment: Report given to RN, Post -op Vital signs reviewed and stable and Patient moving all extremities  Post vital signs: Reviewed and stable  Last Vitals:  Vitals:   05/13/16 0918  BP: (!) 150/73  Pulse: 93  Resp: 15  Temp: 36.7 C    Last Pain:  Vitals:   05/13/16 0918  TempSrc: Oral         Complications: No apparent anesthesia complications

## 2016-05-20 DIAGNOSIS — H353131 Nonexudative age-related macular degeneration, bilateral, early dry stage: Secondary | ICD-10-CM | POA: Diagnosis not present

## 2016-05-20 DIAGNOSIS — Z961 Presence of intraocular lens: Secondary | ICD-10-CM | POA: Diagnosis not present

## 2016-05-20 DIAGNOSIS — H04123 Dry eye syndrome of bilateral lacrimal glands: Secondary | ICD-10-CM | POA: Diagnosis not present

## 2016-05-26 ENCOUNTER — Other Ambulatory Visit: Payer: Self-pay | Admitting: Dermatology

## 2016-05-26 DIAGNOSIS — L603 Nail dystrophy: Secondary | ICD-10-CM | POA: Diagnosis not present

## 2016-05-26 DIAGNOSIS — D492 Neoplasm of unspecified behavior of bone, soft tissue, and skin: Secondary | ICD-10-CM | POA: Diagnosis not present

## 2016-05-26 DIAGNOSIS — C44319 Basal cell carcinoma of skin of other parts of face: Secondary | ICD-10-CM | POA: Diagnosis not present

## 2016-05-26 DIAGNOSIS — L821 Other seborrheic keratosis: Secondary | ICD-10-CM | POA: Diagnosis not present

## 2016-05-26 DIAGNOSIS — L738 Other specified follicular disorders: Secondary | ICD-10-CM | POA: Diagnosis not present

## 2016-06-09 DIAGNOSIS — G4733 Obstructive sleep apnea (adult) (pediatric): Secondary | ICD-10-CM | POA: Diagnosis not present

## 2016-06-10 DIAGNOSIS — E559 Vitamin D deficiency, unspecified: Secondary | ICD-10-CM | POA: Diagnosis not present

## 2016-06-10 DIAGNOSIS — R109 Unspecified abdominal pain: Secondary | ICD-10-CM | POA: Diagnosis not present

## 2016-06-10 DIAGNOSIS — Z0000 Encounter for general adult medical examination without abnormal findings: Secondary | ICD-10-CM | POA: Diagnosis not present

## 2016-06-10 DIAGNOSIS — Z1389 Encounter for screening for other disorder: Secondary | ICD-10-CM | POA: Diagnosis not present

## 2016-06-10 DIAGNOSIS — R102 Pelvic and perineal pain: Secondary | ICD-10-CM | POA: Diagnosis not present

## 2016-06-10 DIAGNOSIS — E7800 Pure hypercholesterolemia, unspecified: Secondary | ICD-10-CM | POA: Diagnosis not present

## 2016-06-24 DIAGNOSIS — R102 Pelvic and perineal pain: Secondary | ICD-10-CM | POA: Diagnosis not present

## 2016-06-25 DIAGNOSIS — R102 Pelvic and perineal pain: Secondary | ICD-10-CM | POA: Diagnosis not present

## 2016-08-01 DIAGNOSIS — L570 Actinic keratosis: Secondary | ICD-10-CM | POA: Diagnosis not present

## 2016-08-01 DIAGNOSIS — C44319 Basal cell carcinoma of skin of other parts of face: Secondary | ICD-10-CM | POA: Diagnosis not present

## 2016-11-05 DIAGNOSIS — Z85828 Personal history of other malignant neoplasm of skin: Secondary | ICD-10-CM | POA: Diagnosis not present

## 2016-11-05 DIAGNOSIS — L570 Actinic keratosis: Secondary | ICD-10-CM | POA: Diagnosis not present

## 2016-12-05 DIAGNOSIS — E7800 Pure hypercholesterolemia, unspecified: Secondary | ICD-10-CM | POA: Diagnosis not present

## 2016-12-05 DIAGNOSIS — L299 Pruritus, unspecified: Secondary | ICD-10-CM | POA: Diagnosis not present

## 2016-12-05 DIAGNOSIS — M810 Age-related osteoporosis without current pathological fracture: Secondary | ICD-10-CM | POA: Diagnosis not present

## 2016-12-05 DIAGNOSIS — Z79899 Other long term (current) drug therapy: Secondary | ICD-10-CM | POA: Diagnosis not present

## 2017-02-03 DIAGNOSIS — R3915 Urgency of urination: Secondary | ICD-10-CM | POA: Diagnosis not present

## 2017-03-11 DIAGNOSIS — Z23 Encounter for immunization: Secondary | ICD-10-CM | POA: Diagnosis not present

## 2017-03-12 DIAGNOSIS — E559 Vitamin D deficiency, unspecified: Secondary | ICD-10-CM | POA: Diagnosis not present

## 2017-03-13 DIAGNOSIS — Z1231 Encounter for screening mammogram for malignant neoplasm of breast: Secondary | ICD-10-CM | POA: Diagnosis not present

## 2017-03-13 DIAGNOSIS — Z853 Personal history of malignant neoplasm of breast: Secondary | ICD-10-CM | POA: Diagnosis not present

## 2017-04-28 DIAGNOSIS — S76211A Strain of adductor muscle, fascia and tendon of right thigh, initial encounter: Secondary | ICD-10-CM | POA: Diagnosis not present

## 2017-04-28 DIAGNOSIS — M25532 Pain in left wrist: Secondary | ICD-10-CM | POA: Diagnosis not present

## 2017-05-19 DIAGNOSIS — D229 Melanocytic nevi, unspecified: Secondary | ICD-10-CM | POA: Diagnosis not present

## 2017-05-19 DIAGNOSIS — L570 Actinic keratosis: Secondary | ICD-10-CM | POA: Diagnosis not present

## 2017-05-19 DIAGNOSIS — L821 Other seborrheic keratosis: Secondary | ICD-10-CM | POA: Diagnosis not present

## 2017-05-26 DIAGNOSIS — G4733 Obstructive sleep apnea (adult) (pediatric): Secondary | ICD-10-CM | POA: Diagnosis not present

## 2017-05-26 DIAGNOSIS — H353130 Nonexudative age-related macular degeneration, bilateral, stage unspecified: Secondary | ICD-10-CM | POA: Diagnosis not present

## 2017-05-26 DIAGNOSIS — H18413 Arcus senilis, bilateral: Secondary | ICD-10-CM | POA: Diagnosis not present

## 2017-05-26 DIAGNOSIS — H02839 Dermatochalasis of unspecified eye, unspecified eyelid: Secondary | ICD-10-CM | POA: Diagnosis not present

## 2017-05-26 DIAGNOSIS — H04123 Dry eye syndrome of bilateral lacrimal glands: Secondary | ICD-10-CM | POA: Diagnosis not present

## 2017-06-15 DIAGNOSIS — J069 Acute upper respiratory infection, unspecified: Secondary | ICD-10-CM | POA: Diagnosis not present

## 2017-06-15 DIAGNOSIS — J209 Acute bronchitis, unspecified: Secondary | ICD-10-CM | POA: Diagnosis not present

## 2017-07-01 DIAGNOSIS — Z79899 Other long term (current) drug therapy: Secondary | ICD-10-CM | POA: Diagnosis not present

## 2017-07-01 DIAGNOSIS — Z1389 Encounter for screening for other disorder: Secondary | ICD-10-CM | POA: Diagnosis not present

## 2017-07-01 DIAGNOSIS — E7800 Pure hypercholesterolemia, unspecified: Secondary | ICD-10-CM | POA: Diagnosis not present

## 2017-07-01 DIAGNOSIS — E559 Vitamin D deficiency, unspecified: Secondary | ICD-10-CM | POA: Diagnosis not present

## 2017-07-01 DIAGNOSIS — G43909 Migraine, unspecified, not intractable, without status migrainosus: Secondary | ICD-10-CM | POA: Diagnosis not present

## 2017-07-01 DIAGNOSIS — Z0000 Encounter for general adult medical examination without abnormal findings: Secondary | ICD-10-CM | POA: Diagnosis not present

## 2017-07-13 DIAGNOSIS — G43719 Chronic migraine without aura, intractable, without status migrainosus: Secondary | ICD-10-CM | POA: Diagnosis not present

## 2017-07-13 DIAGNOSIS — G43019 Migraine without aura, intractable, without status migrainosus: Secondary | ICD-10-CM | POA: Diagnosis not present

## 2017-08-23 ENCOUNTER — Encounter (HOSPITAL_COMMUNITY): Payer: Self-pay | Admitting: Emergency Medicine

## 2017-08-23 ENCOUNTER — Emergency Department (HOSPITAL_COMMUNITY)
Admission: EM | Admit: 2017-08-23 | Discharge: 2017-08-23 | Disposition: A | Discharge status: Home / Self Care | Payer: Medicare Other | Attending: Emergency Medicine | Admitting: Emergency Medicine

## 2017-08-23 ENCOUNTER — Emergency Department (HOSPITAL_COMMUNITY): Payer: Medicare Other

## 2017-08-23 DIAGNOSIS — R0602 Shortness of breath: Secondary | ICD-10-CM | POA: Insufficient documentation

## 2017-08-23 DIAGNOSIS — R0789 Other chest pain: Secondary | ICD-10-CM | POA: Diagnosis not present

## 2017-08-23 DIAGNOSIS — Z79899 Other long term (current) drug therapy: Secondary | ICD-10-CM | POA: Diagnosis not present

## 2017-08-23 DIAGNOSIS — Z853 Personal history of malignant neoplasm of breast: Secondary | ICD-10-CM | POA: Insufficient documentation

## 2017-08-23 DIAGNOSIS — R5383 Other fatigue: Secondary | ICD-10-CM | POA: Insufficient documentation

## 2017-08-23 DIAGNOSIS — R072 Precordial pain: Secondary | ICD-10-CM | POA: Diagnosis not present

## 2017-08-23 DIAGNOSIS — Z9011 Acquired absence of right breast and nipple: Secondary | ICD-10-CM | POA: Insufficient documentation

## 2017-08-23 DIAGNOSIS — R079 Chest pain, unspecified: Secondary | ICD-10-CM | POA: Insufficient documentation

## 2017-08-23 DIAGNOSIS — M79602 Pain in left arm: Secondary | ICD-10-CM | POA: Diagnosis not present

## 2017-08-23 LAB — I-STAT TROPONIN, ED
TROPONIN I, POC: 0 ng/mL (ref 0.00–0.08)
Troponin i, poc: 0 ng/mL (ref 0.00–0.08)

## 2017-08-23 LAB — CBC
HCT: 41.8 % (ref 36.0–46.0)
Hemoglobin: 14.2 g/dL (ref 12.0–15.0)
MCH: 31.7 pg (ref 26.0–34.0)
MCHC: 34 g/dL (ref 30.0–36.0)
MCV: 93.3 fL (ref 78.0–100.0)
PLATELETS: 292 10*3/uL (ref 150–400)
RBC: 4.48 MIL/uL (ref 3.87–5.11)
RDW: 13.6 % (ref 11.5–15.5)
WBC: 9.1 10*3/uL (ref 4.0–10.5)

## 2017-08-23 LAB — BASIC METABOLIC PANEL
ANION GAP: 10 (ref 5–15)
BUN: 12 mg/dL (ref 6–20)
CALCIUM: 9.5 mg/dL (ref 8.9–10.3)
CO2: 21 mmol/L — AB (ref 22–32)
CREATININE: 0.76 mg/dL (ref 0.44–1.00)
Chloride: 109 mmol/L (ref 101–111)
GFR calc non Af Amer: >60 mL/min (ref >60)
Glucose, Bld: 105 mg/dL — ABNORMAL HIGH (ref 65–99)
Potassium: 3.8 mmol/L (ref 3.5–5.1)
Sodium: 140 mmol/L (ref 135–145)

## 2017-08-23 MED ORDER — NITROGLYCERIN 0.4 MG SL SUBL: 0.4000 mg | SUBLINGUAL_TABLET | SUBLINGUAL | Status: DC | PRN

## 2017-08-23 NOTE — ED Notes (Signed)
Pt refused nitro stating that she has hx of Migraines and does not want to get a headache.

## 2017-08-23 NOTE — Discharge Instructions (Signed)
Your evaluated in the emergency department for chest pain.  Your EKG chest x-ray and blood work were unremarkable.  We discussed admission versus going home with close follow-up with her primary care doctor and you chose to go home.  Please take an aspirin a day and call your doctor tomorrow for close follow-up.  If your chest pain recurs you should return to the emergency department.

## 2017-08-23 NOTE — ED Notes (Signed)
Pt ambulated to restroom from room, tolerated well. 

## 2017-08-23 NOTE — ED Triage Notes (Signed)
Pt to ER for evaluation of lower chest tightness/upper epigastric tightness with associated left arm discomfort onset this morning at 3-4 am, states is worse with movement, is intermittent in nature. Pt sent here from PCP for abnormal EKG. Received 324 mg aspirin at clinic. Pt is a/o x4.

## 2017-08-23 NOTE — ED Provider Notes (Signed)
[facility] EMERGENCY DEPARTMENT Provider Note   CSN: [id number] Arrival date & time: 08/23/17  1129     History   Chief Complaint Chief Complaint  Patient presents with  . Chest Pain  . Shortness of Breath    HPI Sara Le is a 72 y.o. female.  The history is provided by the patient.  Chest Pain   This is a new problem. The current episode started 12 to 24 hours ago. The problem occurs constantly. The pain is associated with exertion and lifting. The pain is present in the substernal region. The pain is at a severity of 7/10. The quality of the pain is described as pressure-like. The pain radiates to the left shoulder and left arm. Associated symptoms include malaise/fatigue and shortness of breath. Pertinent negatives include no abdominal pain, no back pain, no cough, no diaphoresis, no dizziness, no fever, no nausea, no numbness, no palpitations, no sputum production, no syncope and no vomiting. She has tried nothing for the symptoms. Risk factors include being elderly.  Her past medical history is significant for cancer and hyperlipidemia.  Pertinent negatives for past medical history include no seizures.  Shortness of Breath  Associated symptoms include chest pain. Pertinent negatives include no fever, no sore throat, no ear pain, no cough, no sputum production, no syncope, no vomiting, no abdominal pain and no rash.    Pleasant 72 year old female with history of breast cancer and hyperkalemia presenting with left arm discomfort since last night and today associated with some chest tightness across her ribs and some shortness of breath.  She is never experienced this symptoms in the past.  Shortness of breath is that she feels it is more difficult to speak and takes more effort.  Is not associated with any diaphoresis weakness numbness tingling nausea or vomiting.  She is never had any provocative cardiac workup before.  She went to urgent care today where they  told her EKG was abnormal and gave her 4 baby aspirin.  She presents here for further workup.  Past Medical History:  Diagnosis Date  . Arthritis    general arthritis  . Cancer [facility])    Right breast cancer- s/p right mastectomy -19 yrs ago  . Dry eyes, bilateral    Restasis use daily  . GERD (gastroesophageal reflux disease)   . H/O urinary frequency   . Headache    migraines- much milder now  . Sleep apnea    cpap bedtime- Dr. Felipa Eth, PCP follows    There are no active problems to display for this patient.   Past Surgical History:  Procedure Laterality Date  . APPENDECTOMY     laparoscopic  . BREAST SURGERY      multiple needle biopsiesin past.  . CATARACT EXTRACTION, BILATERAL    . COLONOSCOPY WITH PROPOFOL N/A 05/13/2016   Procedure: COLONOSCOPY WITH PROPOFOL;  Surgeon: Garlan Fair, MD;  Location: [facility] ENDOSCOPY;  Service: Endoscopy;  Laterality: N/A;  . MASTECTOMY Right    Mastectomy  . RECONSTRUCTION BREAST W/ TRAM FLAP Right    Right breast tram flap  . TONSILLECTOMY     child    OB History    No data available       Home Medications    Prior to Admission medications   Medication Sig Start Date End Date Taking? Authorizing Provider  atorvastatin (LIPITOR) 10 MG tablet Take 10 mg by mouth at bedtime.    [provider]  b complex vitamins  tablet Take 1 tablet by mouth 2 (two) times daily.    [provider]  calcium-vitamin D (OSCAL WITH D) 500-200 MG-UNIT tablet Take 1 tablet by mouth daily.    [provider]  cetirizine (ZYRTEC) 10 MG tablet Take 10 mg by mouth at bedtime.    [provider]  co-enzyme Q-10 30 MG capsule Take 100 mg by mouth daily.    [provider]  cycloSPORINE (RESTASIS) 0.05 % ophthalmic emulsion Place 1 drop into both eyes 2 (two) times daily.    [provider]  magnesium oxide (MAG-OX) 400 MG tablet Take 400 mg by mouth daily.    [provider]  Multiple  Vitamins-Minerals (PRESERVISION AREDS 2) CAPS Take 2 capsules by mouth 2 (two) times daily.    [provider]  omega-3 acid ethyl esters (LOVAZA) 1 g capsule Take 1 g by mouth daily.    [provider]  ranitidine (ZANTAC) 150 MG tablet Take 150 mg by mouth at bedtime. 2  tablets at bedtime daily    [provider]  topiramate (TOPAMAX) 100 MG tablet Take 100 mg by mouth at bedtime. Takes  3 tablets total 300 mg daily    [provider]  Vitamin D, Ergocalciferol, (DRISDOL) 50000 units CAPS capsule Take 50,000 Units by mouth every 14 (fourteen) days. Tuesday( every other week)    [provider]    Family History History reviewed. No pertinent family history.  Social History Social History   Tobacco Use  . Smoking status: Never Smoker  . Smokeless tobacco: Never Used  Substance Use Topics  . Alcohol use: No  . Drug use: No     Allergies   Adhesive [tape]   Review of Systems Review of Systems  Constitutional: Positive for malaise/fatigue. Negative for chills, diaphoresis and fever.  HENT: Negative for ear pain and sore throat.   Eyes: Negative for pain and visual disturbance.  Respiratory: Positive for shortness of breath. Negative for cough and sputum production.   Cardiovascular: Positive for chest pain. Negative for palpitations and syncope.  Gastrointestinal: Negative for abdominal pain, nausea and vomiting.  Genitourinary: Negative for dysuria and hematuria.  Musculoskeletal: Negative for arthralgias and back pain.  Skin: Negative for color change and rash.  Neurological: Negative for dizziness, seizures, syncope and numbness.  All other systems reviewed and are negative.    Physical Exam Updated Vital Signs BP 138/79 (BP Location: Left Arm)   Pulse 75   Temp 97.7 F (36.5 C) (Oral)   Resp 18   SpO2 99%   Physical Exam  Constitutional: She appears well-developed and well-nourished. No distress.  HENT:  Head:  Normocephalic and atraumatic.  Eyes: Conjunctivae are normal.  Neck: Neck supple.  Cardiovascular: Normal rate, regular rhythm and normal pulses.  No murmur heard. Pulmonary/Chest: Effort normal and breath sounds normal. No respiratory distress.  Abdominal: Soft. There is no tenderness.  Musculoskeletal: Normal range of motion. She exhibits no edema.       Right lower leg: Normal. She exhibits no tenderness and no edema.       Left lower leg: Normal. She exhibits no tenderness and no edema.  Neurological: She is alert.  Skin: Skin is warm and dry. Capillary refill takes less than 2 seconds.  Psychiatric: She has a normal mood and affect.  Nursing note and vitals reviewed.    ED Treatments / Results  Labs (all labs ordered are listed, but only abnormal results are displayed) Labs Reviewed  BASIC METABOLIC PANEL - Abnormal; Notable for the following components:      Result Value   CO2 21 (*)    Glucose, Bld 105 (*)    All other components within normal limits  CBC  I-STAT TROPONIN, ED  I-STAT TROPONIN, ED    EKG  EKG Interpretation  Date/Time:  Sunday August 23 2017 11:32:59 EST Ventricular Rate:  85 PR Interval:  172 QRS Duration: 84 QT Interval:  378 QTC Calculation: 449 R Axis:   85 Text Interpretation:  Normal sinus rhythm Right atrial enlargement Borderline ECG no prior ecg to compare with Confirmed by Aletta Edouard [phone]) on 08/23/2017 12:41:36 PM       Radiology Dg Chest 2 View  Result Date: 08/23/2017 CLINICAL DATA:  Chest pain, shortness of Breath EXAM: CHEST  2 VIEW COMPARISON:  05/28/2015 FINDINGS: Heart and mediastinal contours are within normal limits. No focal opacities or effusions. No acute bony abnormality. IMPRESSION: No active cardiopulmonary disease. Electronically Signed   By: Rolm Baptise M.D.   On: 08/23/2017 12:16    Procedures Procedures (including critical care time)  Medications Ordered in ED Medications  nitroGLYCERIN (NITROSTAT)  SL tablet 0.4 mg (not administered)     Initial Impression / Assessment and Plan / ED Course  I have reviewed the triage vital signs and the nursing notes.  Pertinent labs & imaging results that were available during my care of the patient were reviewed by me and considered in my medical decision making (see chart for details).  Clinical Course as of Aug 24 1823  Sun Aug 23, 2017  1557 Patient second troponin is negative.  She was offered admission and she declined saying she is got an appointment to follow-up with her primary care doctor on Friday.  We discussed the risks and benefits of this approach and she understands that if her pain were to recur she should probably represent to the emergency department.  I also asked her to call her primary care doctor tomorrow and see if they may want to see her a little bit earlier.  She also was instructed to take full dose aspirin.  [MB]    Clinical Course User Index [MB] Hayden Rasmussen, MD   Cp fully resolved prior to discharge. Ambulatory without difficulty.  Final Clinical Impressions(s) / ED Diagnoses   Final diagnoses:  Chest pain in adult    ED Discharge Orders    None       Hayden Rasmussen, MD 08/23/17 [phone]

## 2017-08-23 NOTE — ED Notes (Signed)
ED Provider at bedside. 

## 2017-08-26 ENCOUNTER — Other Ambulatory Visit: Payer: Self-pay | Admitting: Geriatric Medicine

## 2017-08-26 DIAGNOSIS — I517 Cardiomegaly: Secondary | ICD-10-CM

## 2017-08-26 DIAGNOSIS — R072 Precordial pain: Secondary | ICD-10-CM

## 2017-09-01 ENCOUNTER — Telehealth (HOSPITAL_COMMUNITY): Payer: Self-pay | Admitting: *Deleted

## 2017-09-01 NOTE — Telephone Encounter (Signed)
Patient given detailed instructions per Myocardial Perfusion Study Information Sheet for the test on 09/04/17 at 0715. Patient notified to arrive 15 minutes early and that it is imperative to arrive on time for appointment to keep from having the test rescheduled.  If you need to cancel or reschedule your appointment, please call the office within 24 hours of your appointment. . Patient verbalized understanding.Sara Le, Ranae Palms

## 2017-09-04 ENCOUNTER — Other Ambulatory Visit (HOSPITAL_COMMUNITY): Payer: Medicare Other

## 2017-09-04 ENCOUNTER — Encounter (HOSPITAL_COMMUNITY): Payer: Medicare Other

## 2017-09-04 ENCOUNTER — Other Ambulatory Visit: Payer: Self-pay

## 2017-09-04 ENCOUNTER — Ambulatory Visit (HOSPITAL_BASED_OUTPATIENT_CLINIC_OR_DEPARTMENT_OTHER): Payer: Medicare Other

## 2017-09-04 ENCOUNTER — Ambulatory Visit (HOSPITAL_COMMUNITY): Payer: Medicare Other | Attending: Cardiovascular Disease

## 2017-09-04 DIAGNOSIS — Z9011 Acquired absence of right breast and nipple: Secondary | ICD-10-CM | POA: Insufficient documentation

## 2017-09-04 DIAGNOSIS — R072 Precordial pain: Secondary | ICD-10-CM | POA: Diagnosis not present

## 2017-09-04 DIAGNOSIS — I517 Cardiomegaly: Secondary | ICD-10-CM

## 2017-09-04 DIAGNOSIS — R079 Chest pain, unspecified: Secondary | ICD-10-CM | POA: Diagnosis not present

## 2017-09-04 DIAGNOSIS — G4730 Sleep apnea, unspecified: Secondary | ICD-10-CM | POA: Insufficient documentation

## 2017-09-04 DIAGNOSIS — R0600 Dyspnea, unspecified: Secondary | ICD-10-CM | POA: Diagnosis not present

## 2017-09-04 DIAGNOSIS — C50911 Malignant neoplasm of unspecified site of right female breast: Secondary | ICD-10-CM | POA: Insufficient documentation

## 2017-09-04 DIAGNOSIS — R9439 Abnormal result of other cardiovascular function study: Secondary | ICD-10-CM | POA: Insufficient documentation

## 2017-09-04 LAB — MYOCARDIAL PERFUSION IMAGING
CHL CUP MPHR: 149 {beats}/min
CHL CUP NUCLEAR SDS: 3
CHL CUP NUCLEAR SSS: 15
CSEPEW: 7 METS
Exercise duration (min): 5 min
Exercise duration (sec): 59 s
LHR: 0.27
LV dias vol: 64 mL (ref 46–106)
LV sys vol: 14 mL
NUC STRESS TID: 1.02
Peak HR: 134 {beats}/min
Percent HR: 89 %
Rest HR: 73 {beats}/min
SRS: 12

## 2017-09-04 MED ORDER — TECHNETIUM TC 99M TETROFOSMIN IV KIT
10.5000 | PACK | Freq: Once | INTRAVENOUS | Status: AC | PRN
  Administered 2017-09-04: 10.5 via INTRAVENOUS
  Filled 2017-09-04: qty 11

## 2017-09-04 MED ORDER — TECHNETIUM TC 99M TETROFOSMIN IV KIT
31.6000 | PACK | Freq: Once | INTRAVENOUS | Status: AC | PRN
  Administered 2017-09-04: 31.6 via INTRAVENOUS
  Filled 2017-09-04: qty 32

## 2017-10-13 DIAGNOSIS — G43019 Migraine without aura, intractable, without status migrainosus: Secondary | ICD-10-CM | POA: Diagnosis not present

## 2017-10-13 DIAGNOSIS — G43719 Chronic migraine without aura, intractable, without status migrainosus: Secondary | ICD-10-CM | POA: Diagnosis not present

## 2017-10-26 DIAGNOSIS — Z0184 Encounter for antibody response examination: Secondary | ICD-10-CM | POA: Diagnosis not present

## 2017-10-26 DIAGNOSIS — Z0000 Encounter for general adult medical examination without abnormal findings: Secondary | ICD-10-CM | POA: Diagnosis not present

## 2017-12-09 DIAGNOSIS — E7800 Pure hypercholesterolemia, unspecified: Secondary | ICD-10-CM | POA: Diagnosis not present

## 2017-12-09 DIAGNOSIS — Z79899 Other long term (current) drug therapy: Secondary | ICD-10-CM | POA: Diagnosis not present

## 2017-12-09 DIAGNOSIS — G4733 Obstructive sleep apnea (adult) (pediatric): Secondary | ICD-10-CM | POA: Diagnosis not present

## 2018-01-11 ENCOUNTER — Ambulatory Visit
Admission: RE | Admit: 2018-01-11 | Discharge: 2018-01-11 | Disposition: A | Discharge status: Home / Self Care | Payer: Medicare Other | Source: Ambulatory Visit | Attending: Geriatric Medicine | Admitting: Geriatric Medicine

## 2018-01-11 ENCOUNTER — Other Ambulatory Visit: Payer: Self-pay | Admitting: Geriatric Medicine

## 2018-01-11 DIAGNOSIS — R05 Cough: Secondary | ICD-10-CM | POA: Diagnosis not present

## 2018-01-11 DIAGNOSIS — R197 Diarrhea, unspecified: Secondary | ICD-10-CM | POA: Diagnosis not present

## 2018-01-11 DIAGNOSIS — J209 Acute bronchitis, unspecified: Secondary | ICD-10-CM

## 2018-01-11 DIAGNOSIS — R110 Nausea: Secondary | ICD-10-CM | POA: Diagnosis not present

## 2018-02-10 DIAGNOSIS — R350 Frequency of micturition: Secondary | ICD-10-CM | POA: Diagnosis not present

## 2018-02-10 DIAGNOSIS — R351 Nocturia: Secondary | ICD-10-CM | POA: Diagnosis not present

## 2018-03-25 DIAGNOSIS — Z853 Personal history of malignant neoplasm of breast: Secondary | ICD-10-CM | POA: Diagnosis not present

## 2018-03-25 DIAGNOSIS — Z1231 Encounter for screening mammogram for malignant neoplasm of breast: Secondary | ICD-10-CM | POA: Diagnosis not present

## 2018-04-06 DIAGNOSIS — L821 Other seborrheic keratosis: Secondary | ICD-10-CM | POA: Diagnosis not present

## 2018-04-06 DIAGNOSIS — D229 Melanocytic nevi, unspecified: Secondary | ICD-10-CM | POA: Diagnosis not present

## 2018-04-06 DIAGNOSIS — B079 Viral wart, unspecified: Secondary | ICD-10-CM | POA: Diagnosis not present

## 2018-04-07 DIAGNOSIS — G4733 Obstructive sleep apnea (adult) (pediatric): Secondary | ICD-10-CM | POA: Diagnosis not present

## 2018-04-07 DIAGNOSIS — Z23 Encounter for immunization: Secondary | ICD-10-CM | POA: Diagnosis not present

## 2018-05-04 DIAGNOSIS — L570 Actinic keratosis: Secondary | ICD-10-CM | POA: Diagnosis not present

## 2018-06-01 DIAGNOSIS — Z961 Presence of intraocular lens: Secondary | ICD-10-CM | POA: Diagnosis not present

## 2018-06-01 DIAGNOSIS — H18413 Arcus senilis, bilateral: Secondary | ICD-10-CM | POA: Diagnosis not present

## 2018-06-01 DIAGNOSIS — H04123 Dry eye syndrome of bilateral lacrimal glands: Secondary | ICD-10-CM | POA: Diagnosis not present

## 2018-06-01 DIAGNOSIS — H353131 Nonexudative age-related macular degeneration, bilateral, early dry stage: Secondary | ICD-10-CM | POA: Diagnosis not present

## 2018-07-21 DIAGNOSIS — Z1389 Encounter for screening for other disorder: Secondary | ICD-10-CM | POA: Diagnosis not present

## 2018-07-21 DIAGNOSIS — Z0000 Encounter for general adult medical examination without abnormal findings: Secondary | ICD-10-CM | POA: Diagnosis not present

## 2018-07-21 DIAGNOSIS — F320 Major depressive disorder, single episode, mild: Secondary | ICD-10-CM | POA: Diagnosis not present

## 2018-07-21 DIAGNOSIS — G43C0 Periodic headache syndromes in child or adult, not intractable: Secondary | ICD-10-CM | POA: Diagnosis not present

## 2018-07-21 DIAGNOSIS — M8588 Other specified disorders of bone density and structure, other site: Secondary | ICD-10-CM | POA: Diagnosis not present

## 2018-07-21 DIAGNOSIS — Z79899 Other long term (current) drug therapy: Secondary | ICD-10-CM | POA: Diagnosis not present

## 2018-07-21 DIAGNOSIS — H9201 Otalgia, right ear: Secondary | ICD-10-CM | POA: Diagnosis not present

## 2018-07-21 DIAGNOSIS — G4733 Obstructive sleep apnea (adult) (pediatric): Secondary | ICD-10-CM | POA: Diagnosis not present

## 2018-07-21 DIAGNOSIS — E7800 Pure hypercholesterolemia, unspecified: Secondary | ICD-10-CM | POA: Diagnosis not present

## 2018-07-21 DIAGNOSIS — K219 Gastro-esophageal reflux disease without esophagitis: Secondary | ICD-10-CM | POA: Diagnosis not present

## 2018-07-21 DIAGNOSIS — Z23 Encounter for immunization: Secondary | ICD-10-CM | POA: Diagnosis not present

## 2018-07-28 DIAGNOSIS — H61039 Chondritis of external ear, unspecified ear: Secondary | ICD-10-CM | POA: Diagnosis not present

## 2018-07-28 DIAGNOSIS — L603 Nail dystrophy: Secondary | ICD-10-CM | POA: Diagnosis not present

## 2018-07-28 DIAGNOSIS — L820 Inflamed seborrheic keratosis: Secondary | ICD-10-CM | POA: Diagnosis not present

## 2018-08-04 DIAGNOSIS — G43019 Migraine without aura, intractable, without status migrainosus: Secondary | ICD-10-CM | POA: Diagnosis not present

## 2018-08-04 DIAGNOSIS — G43719 Chronic migraine without aura, intractable, without status migrainosus: Secondary | ICD-10-CM | POA: Diagnosis not present

## 2018-08-26 DIAGNOSIS — R109 Unspecified abdominal pain: Secondary | ICD-10-CM | POA: Diagnosis not present

## 2018-08-26 DIAGNOSIS — F320 Major depressive disorder, single episode, mild: Secondary | ICD-10-CM | POA: Diagnosis not present

## 2018-10-28 DIAGNOSIS — F320 Major depressive disorder, single episode, mild: Secondary | ICD-10-CM | POA: Diagnosis not present

## 2018-10-28 DIAGNOSIS — H538 Other visual disturbances: Secondary | ICD-10-CM | POA: Diagnosis not present

## 2018-11-01 DIAGNOSIS — Z961 Presence of intraocular lens: Secondary | ICD-10-CM | POA: Diagnosis not present

## 2018-11-01 DIAGNOSIS — H353131 Nonexudative age-related macular degeneration, bilateral, early dry stage: Secondary | ICD-10-CM | POA: Diagnosis not present

## 2018-11-01 DIAGNOSIS — H04123 Dry eye syndrome of bilateral lacrimal glands: Secondary | ICD-10-CM | POA: Diagnosis not present

## 2018-11-01 DIAGNOSIS — H18413 Arcus senilis, bilateral: Secondary | ICD-10-CM | POA: Diagnosis not present

## 2019-01-19 DIAGNOSIS — G43019 Migraine without aura, intractable, without status migrainosus: Secondary | ICD-10-CM | POA: Diagnosis not present

## 2019-01-19 DIAGNOSIS — G43719 Chronic migraine without aura, intractable, without status migrainosus: Secondary | ICD-10-CM | POA: Diagnosis not present

## 2019-03-07 IMAGING — NM NM MISC PROCEDURE
7 series · 42 of 42 positions shown · non-contrast
Comparison: none

[Series 1: wbr_r-proj_st rest · 6.51mm/px · 6 of 64 frames shown (1 of 2)]
[frame 6/64]
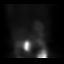
[frame 16/64]
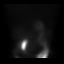
[frame 27/64]
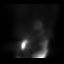
[frame 38/64]
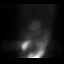
[frame 48/64]
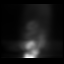
[frame 59/64]
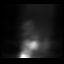

[Series 1: rest · 6.51mm/px · 6 of 64 frames shown (1 of 2)]
[frame 6/64]
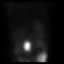
[frame 16/64]
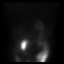
[frame 27/64]
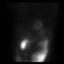
[frame 38/64]
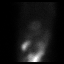
[frame 48/64]
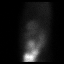
[frame 59/64]
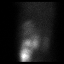

[Series 2: wbr_r-proj_st rest · 6.51mm/px · 6 of 64 frames shown (2 of 2)]
[frame 6/64]
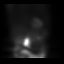
[frame 16/64]
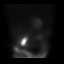
[frame 27/64]
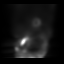
[frame 38/64]
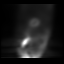
[frame 48/64]
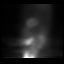
[frame 59/64]
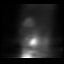

[Series 2: rest · 6.51mm/px · 6 of 64 frames shown (2 of 2)]
[frame 6/64]
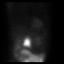
[frame 16/64]
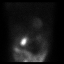
[frame 27/64]
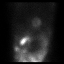
[frame 38/64]
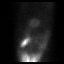
[frame 48/64]
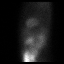
[frame 59/64]
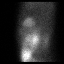

[Series 3: stress - gated · 6.51mm/px · 6 of 512 frames shown]
[frame 43/512  full-range]
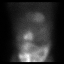
[frame 128/512  full-range]
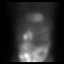
[frame 214/512  full-range]
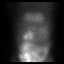
[frame 299/512  full-range]
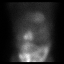
[frame 384/512  full-range]
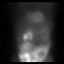
[frame 470/512  full-range]
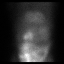

[Series 3: wbr_s-proj_st stress - gated · 6.51mm/px · 6 of 512 frames shown]
[frame 43/512]
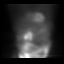
[frame 128/512]
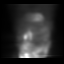
[frame 214/512]
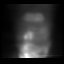
[frame 299/512]
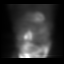
[frame 384/512]
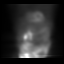
[frame 470/512]
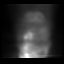

[Series 4: stress - perfusion · 6.51mm/px · 6 of 64 frames shown]
[frame 6/64]
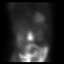
[frame 16/64]
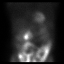
[frame 27/64]
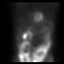
[frame 38/64]
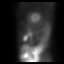
[frame 48/64]
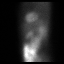
[frame 59/64]
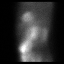

[42 of 42 positions shown; findings below may reference images not displayed]

Canned report from images found in remote index.

Refer to host system for actual result text.

## 2019-04-06 ENCOUNTER — Other Ambulatory Visit: Payer: Self-pay | Admitting: Dermatology

## 2019-04-06 DIAGNOSIS — G4733 Obstructive sleep apnea (adult) (pediatric): Secondary | ICD-10-CM | POA: Diagnosis not present

## 2019-04-06 DIAGNOSIS — L570 Actinic keratosis: Secondary | ICD-10-CM | POA: Diagnosis not present

## 2019-04-06 DIAGNOSIS — L820 Inflamed seborrheic keratosis: Secondary | ICD-10-CM | POA: Diagnosis not present

## 2019-04-06 DIAGNOSIS — D485 Neoplasm of uncertain behavior of skin: Secondary | ICD-10-CM | POA: Diagnosis not present

## 2019-04-06 DIAGNOSIS — Z23 Encounter for immunization: Secondary | ICD-10-CM | POA: Diagnosis not present

## 2019-04-08 DIAGNOSIS — M8589 Other specified disorders of bone density and structure, multiple sites: Secondary | ICD-10-CM | POA: Diagnosis not present

## 2019-04-08 DIAGNOSIS — R29890 Loss of height: Secondary | ICD-10-CM | POA: Diagnosis not present

## 2019-04-08 DIAGNOSIS — Z1231 Encounter for screening mammogram for malignant neoplasm of breast: Secondary | ICD-10-CM | POA: Diagnosis not present

## 2019-04-08 DIAGNOSIS — Z853 Personal history of malignant neoplasm of breast: Secondary | ICD-10-CM | POA: Diagnosis not present

## 2019-04-08 DIAGNOSIS — Z8262 Family history of osteoporosis: Secondary | ICD-10-CM | POA: Diagnosis not present

## 2019-06-14 DIAGNOSIS — H353131 Nonexudative age-related macular degeneration, bilateral, early dry stage: Secondary | ICD-10-CM | POA: Diagnosis not present

## 2019-06-14 DIAGNOSIS — Z961 Presence of intraocular lens: Secondary | ICD-10-CM | POA: Diagnosis not present

## 2019-06-14 DIAGNOSIS — H04123 Dry eye syndrome of bilateral lacrimal glands: Secondary | ICD-10-CM | POA: Diagnosis not present

## 2019-06-14 DIAGNOSIS — H18413 Arcus senilis, bilateral: Secondary | ICD-10-CM | POA: Diagnosis not present

## 2019-07-14 DIAGNOSIS — E7800 Pure hypercholesterolemia, unspecified: Secondary | ICD-10-CM | POA: Diagnosis not present

## 2019-07-14 DIAGNOSIS — M8580 Other specified disorders of bone density and structure, unspecified site: Secondary | ICD-10-CM | POA: Diagnosis not present

## 2019-07-14 DIAGNOSIS — F320 Major depressive disorder, single episode, mild: Secondary | ICD-10-CM | POA: Diagnosis not present

## 2019-08-01 DIAGNOSIS — Z79899 Other long term (current) drug therapy: Secondary | ICD-10-CM | POA: Diagnosis not present

## 2019-08-01 DIAGNOSIS — E559 Vitamin D deficiency, unspecified: Secondary | ICD-10-CM | POA: Diagnosis not present

## 2019-08-01 DIAGNOSIS — K219 Gastro-esophageal reflux disease without esophagitis: Secondary | ICD-10-CM | POA: Diagnosis not present

## 2019-08-01 DIAGNOSIS — Z0000 Encounter for general adult medical examination without abnormal findings: Secondary | ICD-10-CM | POA: Diagnosis not present

## 2019-08-01 DIAGNOSIS — E7800 Pure hypercholesterolemia, unspecified: Secondary | ICD-10-CM | POA: Diagnosis not present

## 2019-08-01 DIAGNOSIS — Z1389 Encounter for screening for other disorder: Secondary | ICD-10-CM | POA: Diagnosis not present

## 2019-08-01 DIAGNOSIS — M25511 Pain in right shoulder: Secondary | ICD-10-CM | POA: Diagnosis not present

## 2019-08-01 DIAGNOSIS — M8580 Other specified disorders of bone density and structure, unspecified site: Secondary | ICD-10-CM | POA: Diagnosis not present

## 2019-08-01 DIAGNOSIS — G4733 Obstructive sleep apnea (adult) (pediatric): Secondary | ICD-10-CM | POA: Diagnosis not present

## 2019-08-01 DIAGNOSIS — K591 Functional diarrhea: Secondary | ICD-10-CM | POA: Diagnosis not present

## 2019-08-04 ENCOUNTER — Ambulatory Visit: Payer: Medicare Other | Attending: Internal Medicine

## 2019-08-04 DIAGNOSIS — Z23 Encounter for immunization: Secondary | ICD-10-CM | POA: Insufficient documentation

## 2019-08-04 NOTE — Progress Notes (Signed)
   Covid-19 Vaccination Clinic  Name:  VENESA TOLLEFSEN    MRN: [id number] DOB: 17-Jun-1946  08/04/2019  Ms. Steimle was observed post Covid-19 immunization for 15 minutes without incidence. She was provided with Vaccine Information Sheet and instruction to access the V-Safe system.   Ms. Hedgeman was instructed to call 911 with any severe reactions post vaccine: Marland Kitchen Difficulty breathing  . Swelling of your face and throat  . A fast heartbeat  . A bad rash all over your body  . Dizziness and weakness    Immunizations Administered    Name Date Dose VIS Date Route   Pfizer COVID-19 Vaccine 08/04/2019  4:14 PM 0.3 mL 06/24/2019 Intramuscular   Manufacturer: [place]   Lot: [id number]   [place]: [id number]

## 2019-08-10 DIAGNOSIS — E7800 Pure hypercholesterolemia, unspecified: Secondary | ICD-10-CM | POA: Diagnosis not present

## 2019-08-10 DIAGNOSIS — F320 Major depressive disorder, single episode, mild: Secondary | ICD-10-CM | POA: Diagnosis not present

## 2019-08-10 DIAGNOSIS — M8580 Other specified disorders of bone density and structure, unspecified site: Secondary | ICD-10-CM | POA: Diagnosis not present

## 2019-08-24 ENCOUNTER — Ambulatory Visit: Payer: Medicare Other

## 2019-08-25 ENCOUNTER — Ambulatory Visit: Payer: Medicare Other | Attending: Internal Medicine

## 2019-08-25 DIAGNOSIS — Z23 Encounter for immunization: Secondary | ICD-10-CM | POA: Insufficient documentation

## 2019-08-25 NOTE — Progress Notes (Signed)
   Covid-19 Vaccination Clinic  Name:  Sara Le    MRN: [id number] DOB: 1945/08/06  08/25/2019  [name] was observed post Covid-19 immunization for 15 minutes without incidence. She was provided with Vaccine Information Sheet and instruction to access the V-Safe system.   [name] was instructed to call 911 with any severe reactions post vaccine: Marland Kitchen Difficulty breathing  . Swelling of your face and throat  . A fast heartbeat  . A bad rash all over your body  . Dizziness and weakness    Immunizations Administered    Name Date Dose VIS Date Route   Pfizer COVID-19 Vaccine 08/25/2019 12:49 PM 0.3 mL 06/24/2019 Intramuscular   Manufacturer: [place]   Lot: [id number]   [place]: [id number]

## 2019-09-08 DIAGNOSIS — M8580 Other specified disorders of bone density and structure, unspecified site: Secondary | ICD-10-CM | POA: Diagnosis not present

## 2019-09-08 DIAGNOSIS — F320 Major depressive disorder, single episode, mild: Secondary | ICD-10-CM | POA: Diagnosis not present

## 2019-09-08 DIAGNOSIS — E7800 Pure hypercholesterolemia, unspecified: Secondary | ICD-10-CM | POA: Diagnosis not present

## 2019-10-05 DIAGNOSIS — F320 Major depressive disorder, single episode, mild: Secondary | ICD-10-CM | POA: Diagnosis not present

## 2019-10-05 DIAGNOSIS — M8580 Other specified disorders of bone density and structure, unspecified site: Secondary | ICD-10-CM | POA: Diagnosis not present

## 2019-10-05 DIAGNOSIS — E7800 Pure hypercholesterolemia, unspecified: Secondary | ICD-10-CM | POA: Diagnosis not present

## 2019-10-31 DIAGNOSIS — F320 Major depressive disorder, single episode, mild: Secondary | ICD-10-CM | POA: Diagnosis not present

## 2019-10-31 DIAGNOSIS — E7800 Pure hypercholesterolemia, unspecified: Secondary | ICD-10-CM | POA: Diagnosis not present

## 2019-10-31 DIAGNOSIS — M8580 Other specified disorders of bone density and structure, unspecified site: Secondary | ICD-10-CM | POA: Diagnosis not present

## 2019-11-07 ENCOUNTER — Ambulatory Visit (INDEPENDENT_AMBULATORY_CARE_PROVIDER_SITE_OTHER): Payer: Medicare Other | Admitting: Dermatology

## 2019-11-07 ENCOUNTER — Encounter: Payer: Self-pay | Admitting: Dermatology

## 2019-11-07 ENCOUNTER — Other Ambulatory Visit: Payer: Self-pay

## 2019-11-07 DIAGNOSIS — D229 Melanocytic nevi, unspecified: Secondary | ICD-10-CM

## 2019-11-07 DIAGNOSIS — D2239 Melanocytic nevi of other parts of face: Secondary | ICD-10-CM | POA: Diagnosis not present

## 2019-11-07 DIAGNOSIS — L570 Actinic keratosis: Secondary | ICD-10-CM | POA: Diagnosis not present

## 2019-11-07 NOTE — Progress Notes (Addendum)
   Follow-Up Visit   Subjective  Sara Le is a 74 y.o. female who presents for the following: Skin Problem (Check spot on right side face. been there since september 2020. No bleeding or pain just a flesh colored spot that wont go away. ).  Growth Location: Right cheek Duration: Months Quality: persists Associated Signs/Symptoms: Modifying Factors:  Severity:  Timing: Context:   The following portions of the chart were reviewed this encounter and updated as appropriate: Tobacco  Allergies  Meds  Problems  Med Hx  Surg Hx  Fam Hx      Objective  Well appearing patient in no apparent distress; mood and affect are within normal limits.  A focused examination was performed including head, neck, back. Relevant physical exam findings are noted in the Assessment and Plan.   Assessment & Plan  AK (actinic keratosis) Right Buccal Cheek   Destruction of lesion - Right Buccal Cheek   Destruction method: cryotherapy   Informed consent: discussed and consent obtained   Timeout:  patient name, date of birth, surgical site, and procedure verified Lesion destroyed using liquid nitrogen: Yes   Region frozen until ice ball extended beyond lesion: Yes   Outcome: patient tolerated procedure well with no complications    Actinic keratoses Right Dorsal Hand  Liquid nitrogen freeze 5 sec.  Destruction of lesion - Right Dorsal Hand  Destruction method: cryotherapy   Informed consent: discussed and consent obtained   Timeout:  patient name, date of birth, surgical site, and procedure verified Lesion destroyed using liquid nitrogen: Yes   Region frozen until ice ball extended beyond lesion: Yes   Outcome: patient tolerated procedure well with no complications    Nevus Mid Forehead actinic keratosis right cheek: LN2 freeze.

## 2019-11-09 ENCOUNTER — Encounter: Payer: Self-pay | Admitting: Dermatology

## 2019-12-02 DIAGNOSIS — E7800 Pure hypercholesterolemia, unspecified: Secondary | ICD-10-CM | POA: Diagnosis not present

## 2019-12-02 DIAGNOSIS — F320 Major depressive disorder, single episode, mild: Secondary | ICD-10-CM | POA: Diagnosis not present

## 2019-12-02 DIAGNOSIS — M8580 Other specified disorders of bone density and structure, unspecified site: Secondary | ICD-10-CM | POA: Diagnosis not present

## 2019-12-05 ENCOUNTER — Telehealth: Payer: Self-pay | Admitting: Dermatology

## 2019-12-05 NOTE — Telephone Encounter (Signed)
Patient left message on office voice mail saying that she was giving [place] a 4 week follow up phone call saying that the places that were frozen with liquid nitrogen are fine.  Patient does not want a follow up call being that everything is fine.

## 2019-12-05 NOTE — Telephone Encounter (Signed)
See patient's message.

## 2020-02-01 DIAGNOSIS — E7800 Pure hypercholesterolemia, unspecified: Secondary | ICD-10-CM | POA: Diagnosis not present

## 2020-02-01 DIAGNOSIS — F320 Major depressive disorder, single episode, mild: Secondary | ICD-10-CM | POA: Diagnosis not present

## 2020-02-01 DIAGNOSIS — M8580 Other specified disorders of bone density and structure, unspecified site: Secondary | ICD-10-CM | POA: Diagnosis not present

## 2020-03-14 DIAGNOSIS — Z23 Encounter for immunization: Secondary | ICD-10-CM | POA: Diagnosis not present

## 2020-04-09 DIAGNOSIS — G4733 Obstructive sleep apnea (adult) (pediatric): Secondary | ICD-10-CM | POA: Diagnosis not present

## 2020-04-10 ENCOUNTER — Ambulatory Visit (INDEPENDENT_AMBULATORY_CARE_PROVIDER_SITE_OTHER): Payer: Medicare Other | Admitting: Dermatology

## 2020-04-10 ENCOUNTER — Encounter: Payer: Self-pay | Admitting: Dermatology

## 2020-04-10 ENCOUNTER — Other Ambulatory Visit: Payer: Self-pay

## 2020-04-10 DIAGNOSIS — L821 Other seborrheic keratosis: Secondary | ICD-10-CM

## 2020-04-10 DIAGNOSIS — L820 Inflamed seborrheic keratosis: Secondary | ICD-10-CM | POA: Diagnosis not present

## 2020-04-10 DIAGNOSIS — Z1283 Encounter for screening for malignant neoplasm of skin: Secondary | ICD-10-CM | POA: Diagnosis not present

## 2020-04-10 DIAGNOSIS — D485 Neoplasm of uncertain behavior of skin: Secondary | ICD-10-CM

## 2020-04-10 NOTE — Patient Instructions (Addendum)
Biopsy, Surgery (Curettage) & Surgery (Excision) Aftercare Instructions  1. Okay to remove bandage in 24 hours  2. Wash area with soap and water  3. Apply Vaseline to area twice daily until healed (Not Neosporin)  4. Okay to cover with a Band-Aid to decrease the chance of infection or prevent irritation from clothing; also it's okay to uncover lesion at home.  5. Suture instructions: return to our office in 7-10 or 10-14 days for a nurse visit for suture removal. Variable healing with sutures, if pain or itching occurs call our office. It's okay to shower or bathe 24 hours after sutures are given.  6. The following risks may occur after a biopsy, curettage or excision: bleeding, scarring, discoloration, recurrence, infection (redness, yellow drainage, pain or swelling).  7. For questions, concerns and results call our office at [place] before 4pm & Friday before 3pm. Biopsy results will be available in 1 week.  Follow up visit for Sara Le date of birth 06/07/1946 precipitated by rapid growth of a crust on the right shoulder.  Examination showed a 3 x 5 mm pink-tan inflamed crust which could represent a new superficial nonmelanoma skin cancer but is more likely an inflamed keratosis.  Shave biopsy done and Laverna Peace can check on her MyChart or call my office on Monday.  The rest of her examination from the waist up showed no atypical moles or skin cancer.  Multiple benign keratoses, noninflamed in the middle of the back which are quite harmless.  If this does not require follow-up, then routine skin examination in 1 year.

## 2020-04-11 DIAGNOSIS — Z23 Encounter for immunization: Secondary | ICD-10-CM | POA: Diagnosis not present

## 2020-04-12 DIAGNOSIS — M8580 Other specified disorders of bone density and structure, unspecified site: Secondary | ICD-10-CM | POA: Diagnosis not present

## 2020-04-12 DIAGNOSIS — E7800 Pure hypercholesterolemia, unspecified: Secondary | ICD-10-CM | POA: Diagnosis not present

## 2020-04-12 DIAGNOSIS — F320 Major depressive disorder, single episode, mild: Secondary | ICD-10-CM | POA: Diagnosis not present

## 2020-04-18 DIAGNOSIS — Z1231 Encounter for screening mammogram for malignant neoplasm of breast: Secondary | ICD-10-CM | POA: Diagnosis not present

## 2020-05-10 NOTE — Progress Notes (Signed)
   Follow-Up Visit   Subjective  Sara Le is a 74 y.o. female who presents for the following: Annual Exam (CHECK SHOULDER).  Annual skin exam Location:  Duration:  Quality:  Associated Signs/Symptoms: Modifying Factors:  Severity:  Timing: Context:   Objective  Well appearing patient in no apparent distress; mood and affect are within normal limits.  Waist up skin examination performed.  Follow up visit for Sara Le date of birth 12/16/1945 precipitated by rapid growth of a crust on the right shoulder.  Examination showed a 3 x 5 mm pink-tan inflamed crust which could represent a new superficial nonmelanoma skin cancer but is more likely an inflamed keratosis.  Shave biopsy done and Sara Le can check on her MyChart or call my office on Monday.  The rest of her examination from the waist up showed no atypical moles or skin cancer.  Multiple benign keratoses, noninflamed in the middle of the back which are quite harmless.  If this does not require follow-up, then routine skin examination in 1 year.    Assessment & Plan    Neoplasm of uncertain behavior of skin Right Shoulder - Anterior  Skin / nail biopsy Type of biopsy: tangential   Informed consent: discussed and consent obtained   Timeout: patient name, date of birth, surgical site, and procedure verified   Anesthesia: the lesion was anesthetized in a standard fashion   Anesthetic:  1% lidocaine w/ epinephrine 1-100,000 local infiltration Instrument used: flexible razor blade   Hemostasis achieved with: ferric subsulfate   Outcome: patient tolerated procedure well   Post-procedure details: sterile dressing applied and wound care instructions given   Dressing type: bandage and petrolatum    Specimen 1 - Surgical pathology Differential Diagnosis: BCC SCC Check Margins: No  Encounter for screening for malignant neoplasm of skin Mid Back  Yearly skin exams     I, Lavonna Monarch, MD, have reviewed all  documentation for this visit.  The documentation on 05/10/20 for the exam, diagnosis, procedures, and orders are all accurate and complete.

## 2020-05-14 ENCOUNTER — Encounter: Payer: Self-pay | Admitting: Dermatology

## 2020-05-23 DIAGNOSIS — K219 Gastro-esophageal reflux disease without esophagitis: Secondary | ICD-10-CM | POA: Diagnosis not present

## 2020-05-23 DIAGNOSIS — E7800 Pure hypercholesterolemia, unspecified: Secondary | ICD-10-CM | POA: Diagnosis not present

## 2020-05-23 DIAGNOSIS — F320 Major depressive disorder, single episode, mild: Secondary | ICD-10-CM | POA: Diagnosis not present

## 2020-05-23 DIAGNOSIS — M8580 Other specified disorders of bone density and structure, unspecified site: Secondary | ICD-10-CM | POA: Diagnosis not present

## 2020-06-14 DIAGNOSIS — M8580 Other specified disorders of bone density and structure, unspecified site: Secondary | ICD-10-CM | POA: Diagnosis not present

## 2020-06-14 DIAGNOSIS — F320 Major depressive disorder, single episode, mild: Secondary | ICD-10-CM | POA: Diagnosis not present

## 2020-06-14 DIAGNOSIS — K219 Gastro-esophageal reflux disease without esophagitis: Secondary | ICD-10-CM | POA: Diagnosis not present

## 2020-06-14 DIAGNOSIS — E7800 Pure hypercholesterolemia, unspecified: Secondary | ICD-10-CM | POA: Diagnosis not present

## 2020-06-19 DIAGNOSIS — Z961 Presence of intraocular lens: Secondary | ICD-10-CM | POA: Diagnosis not present

## 2020-06-19 DIAGNOSIS — H18413 Arcus senilis, bilateral: Secondary | ICD-10-CM | POA: Diagnosis not present

## 2020-06-19 DIAGNOSIS — H353131 Nonexudative age-related macular degeneration, bilateral, early dry stage: Secondary | ICD-10-CM | POA: Diagnosis not present

## 2020-06-19 DIAGNOSIS — H04123 Dry eye syndrome of bilateral lacrimal glands: Secondary | ICD-10-CM | POA: Diagnosis not present

## 2020-08-03 DIAGNOSIS — E7800 Pure hypercholesterolemia, unspecified: Secondary | ICD-10-CM | POA: Diagnosis not present

## 2020-08-03 DIAGNOSIS — F320 Major depressive disorder, single episode, mild: Secondary | ICD-10-CM | POA: Diagnosis not present

## 2020-08-03 DIAGNOSIS — Z1389 Encounter for screening for other disorder: Secondary | ICD-10-CM | POA: Diagnosis not present

## 2020-08-03 DIAGNOSIS — G441 Vascular headache, not elsewhere classified: Secondary | ICD-10-CM | POA: Diagnosis not present

## 2020-08-03 DIAGNOSIS — K219 Gastro-esophageal reflux disease without esophagitis: Secondary | ICD-10-CM | POA: Diagnosis not present

## 2020-08-03 DIAGNOSIS — R197 Diarrhea, unspecified: Secondary | ICD-10-CM | POA: Diagnosis not present

## 2020-08-03 DIAGNOSIS — G4733 Obstructive sleep apnea (adult) (pediatric): Secondary | ICD-10-CM | POA: Diagnosis not present

## 2020-08-03 DIAGNOSIS — Z0000 Encounter for general adult medical examination without abnormal findings: Secondary | ICD-10-CM | POA: Diagnosis not present

## 2020-08-03 DIAGNOSIS — M8580 Other specified disorders of bone density and structure, unspecified site: Secondary | ICD-10-CM | POA: Diagnosis not present

## 2020-08-03 DIAGNOSIS — Z79899 Other long term (current) drug therapy: Secondary | ICD-10-CM | POA: Diagnosis not present

## 2020-08-03 DIAGNOSIS — E559 Vitamin D deficiency, unspecified: Secondary | ICD-10-CM | POA: Diagnosis not present

## 2020-08-08 ENCOUNTER — Other Ambulatory Visit: Payer: Self-pay

## 2020-08-08 ENCOUNTER — Ambulatory Visit (INDEPENDENT_AMBULATORY_CARE_PROVIDER_SITE_OTHER): Payer: Medicare Other | Admitting: Dermatology

## 2020-08-08 ENCOUNTER — Encounter: Payer: Self-pay | Admitting: Dermatology

## 2020-08-08 DIAGNOSIS — L570 Actinic keratosis: Secondary | ICD-10-CM

## 2020-08-08 DIAGNOSIS — L820 Inflamed seborrheic keratosis: Secondary | ICD-10-CM | POA: Diagnosis not present

## 2020-08-08 DIAGNOSIS — Z1283 Encounter for screening for malignant neoplasm of skin: Secondary | ICD-10-CM | POA: Diagnosis not present

## 2020-08-08 DIAGNOSIS — L821 Other seborrheic keratosis: Secondary | ICD-10-CM

## 2020-08-08 DIAGNOSIS — D485 Neoplasm of uncertain behavior of skin: Secondary | ICD-10-CM

## 2020-08-08 NOTE — Patient Instructions (Signed)

## 2020-08-13 DIAGNOSIS — G43019 Migraine without aura, intractable, without status migrainosus: Secondary | ICD-10-CM | POA: Diagnosis not present

## 2020-08-13 DIAGNOSIS — G43719 Chronic migraine without aura, intractable, without status migrainosus: Secondary | ICD-10-CM | POA: Diagnosis not present

## 2020-08-17 ENCOUNTER — Encounter: Payer: Self-pay | Admitting: Dermatology

## 2020-08-18 NOTE — Progress Notes (Signed)
   Follow-Up Visit   Subjective  Sara Le is a 75 y.o. female who presents for the following: Annual Exam (Left neck, left breast, chest & back- + itch sometimes).  New spots on left neck and torso Location:  Duration:  Quality:  Associated Signs/Symptoms: Modifying Factors:  Severity:  Timing: Context:   Objective  Well appearing patient in no apparent distress; mood and affect are within normal limits. Objective  Mid Back: 5 mm tan textured flattopped papule, noninflamed  Objective  Left Abdomen (side) - Upper, Right Lower Back, Right Upper Back: 2 to 4 mm hornlike pink crusts  Objective  Left Anterior Neck: Waxy pink 5 mm papule, I SK versus BCC     Objective  Left Upper Back: Waist up skin examination, no atypical moles or melanoma   All skin waist up examined.   Assessment & Plan    Seborrheic keratosis Mid Back  Leave if stable  AK (actinic keratosis) (3) Left Abdomen (side) - Upper; Right Upper Back; Right Lower Back  Destruction of lesion - Left Abdomen (side) - Upper, Right Lower Back, Right Upper Back Complexity: simple   Destruction method: cryotherapy   Informed consent: discussed and consent obtained   Timeout:  patient name, date of birth, surgical site, and procedure verified Lesion destroyed using liquid nitrogen: Yes   Cryotherapy cycles:  5 Outcome: patient tolerated procedure well with no complications   Post-procedure details: wound care instructions given    Neoplasm of uncertain behavior of skin Left Anterior Neck  Skin / nail biopsy Type of biopsy: tangential   Informed consent: discussed and consent obtained   Timeout: patient name, date of birth, surgical site, and procedure verified   Procedure prep:  Patient was prepped and draped in usual sterile fashion (Non sterile) Prep type:  Chlorhexidine Anesthesia: the lesion was anesthetized in a standard fashion   Anesthetic:  1% lidocaine w/ epinephrine 1-100,000 local  infiltration Instrument used: flexible razor blade   Outcome: patient tolerated procedure well   Post-procedure details: wound care instructions given    Specimen 1 - Surgical pathology Differential Diagnosis: bcc scc  Check Margins: No  Encounter for screening for malignant neoplasm of skin Left Upper Back  Annual dermatologist examination, self examine with spouse twice annually     I, Lavonna Monarch, MD, have reviewed all documentation for this visit.  The documentation on 08/18/20 for the exam, diagnosis, procedures, and orders are all accurate and complete.

## 2020-08-22 DIAGNOSIS — G43019 Migraine without aura, intractable, without status migrainosus: Secondary | ICD-10-CM | POA: Diagnosis not present

## 2020-08-22 DIAGNOSIS — G43719 Chronic migraine without aura, intractable, without status migrainosus: Secondary | ICD-10-CM | POA: Diagnosis not present

## 2020-10-03 DIAGNOSIS — K219 Gastro-esophageal reflux disease without esophagitis: Secondary | ICD-10-CM | POA: Diagnosis not present

## 2020-10-03 DIAGNOSIS — F320 Major depressive disorder, single episode, mild: Secondary | ICD-10-CM | POA: Diagnosis not present

## 2020-10-03 DIAGNOSIS — M8580 Other specified disorders of bone density and structure, unspecified site: Secondary | ICD-10-CM | POA: Diagnosis not present

## 2020-10-03 DIAGNOSIS — E7800 Pure hypercholesterolemia, unspecified: Secondary | ICD-10-CM | POA: Diagnosis not present

## 2020-11-14 DIAGNOSIS — G43719 Chronic migraine without aura, intractable, without status migrainosus: Secondary | ICD-10-CM | POA: Diagnosis not present

## 2020-11-14 DIAGNOSIS — G43019 Migraine without aura, intractable, without status migrainosus: Secondary | ICD-10-CM | POA: Diagnosis not present

## 2020-12-27 DIAGNOSIS — E7800 Pure hypercholesterolemia, unspecified: Secondary | ICD-10-CM | POA: Diagnosis not present

## 2020-12-27 DIAGNOSIS — K219 Gastro-esophageal reflux disease without esophagitis: Secondary | ICD-10-CM | POA: Diagnosis not present

## 2020-12-27 DIAGNOSIS — F320 Major depressive disorder, single episode, mild: Secondary | ICD-10-CM | POA: Diagnosis not present

## 2020-12-27 DIAGNOSIS — M8580 Other specified disorders of bone density and structure, unspecified site: Secondary | ICD-10-CM | POA: Diagnosis not present

## 2021-02-13 ENCOUNTER — Encounter: Payer: Self-pay | Admitting: Dermatology

## 2021-02-13 ENCOUNTER — Ambulatory Visit (INDEPENDENT_AMBULATORY_CARE_PROVIDER_SITE_OTHER): Payer: Medicare Other | Admitting: Dermatology

## 2021-02-13 ENCOUNTER — Other Ambulatory Visit: Payer: Self-pay

## 2021-02-13 DIAGNOSIS — Z808 Family history of malignant neoplasm of other organs or systems: Secondary | ICD-10-CM

## 2021-02-13 DIAGNOSIS — Z85828 Personal history of other malignant neoplasm of skin: Secondary | ICD-10-CM | POA: Diagnosis not present

## 2021-02-13 DIAGNOSIS — L570 Actinic keratosis: Secondary | ICD-10-CM

## 2021-02-13 DIAGNOSIS — L821 Other seborrheic keratosis: Secondary | ICD-10-CM

## 2021-02-13 DIAGNOSIS — D1800 Hemangioma unspecified site: Secondary | ICD-10-CM

## 2021-02-13 DIAGNOSIS — Z1283 Encounter for screening for malignant neoplasm of skin: Secondary | ICD-10-CM | POA: Diagnosis not present

## 2021-02-27 DIAGNOSIS — G4733 Obstructive sleep apnea (adult) (pediatric): Secondary | ICD-10-CM | POA: Diagnosis not present

## 2021-03-03 ENCOUNTER — Encounter: Payer: Self-pay | Admitting: Dermatology

## 2021-03-03 NOTE — Progress Notes (Signed)
   Follow-Up Visit   Subjective  Sara Le is a 75 y.o. female who presents for the following: Annual Exam (Patient here today for skin check she has a list. Personal history of non mole skin cancer. Per patient family history of  non mole skin cancer. ).  General skin examination Location:  Duration:  Quality:  Associated Signs/Symptoms: Modifying Factors:  Severity:  Timing: Context:   Objective  Well appearing patient in no apparent distress; mood and affect are within normal limits. Torso - Posterior (Back) Full  body skin examination: No atypical pigmented lesions, no new or recurrent nonmelanoma skin cancer and  Left Lower Leg - Posterior 4 mm tan verrucous papule  Left Abdomen (side) - Lower Multiple 1 mm smooth red dermal papules  Left Buccal Cheek, Left Forehead (3), Right Buccal Cheek Gritty 3 mm pink crusts    A full examination was performed including scalp, head, eyes, ears, nose, lips, neck, chest, axillae, abdomen, back, buttocks, bilateral upper extremities, bilateral lower extremities, hands, feet, fingers, toes, fingernails, and toenails. All findings within normal limits unless otherwise noted below.  Areas beneath undergarments not fully examined.   Assessment & Plan    Encounter for screening for malignant neoplasm of skin Torso - Posterior (Back)  Annual skin examination, encouraged to self examine with spouse twice annually.  Continue ultraviolet protection.  Seborrheic keratosis Left Lower Leg - Posterior  No intervention currently necessary  Hemangioma, unspecified site Left Abdomen (side) - Lower  No intervention necessary  AK (actinic keratosis) (5) Left Buccal Cheek; Right Buccal Cheek; Left Forehead (3)  Destruction of lesion - Left Buccal Cheek, Left Forehead, Right Buccal Cheek Complexity: simple   Destruction method: cryotherapy   Informed consent: discussed and consent obtained   Timeout:  patient name, date of birth,  surgical site, and procedure verified Lesion destroyed using liquid nitrogen: Yes   Cryotherapy cycles:  3 Outcome: patient tolerated procedure well with no complications        I, Lavonna Monarch, MD, have reviewed all documentation for this visit.  The documentation on 03/03/21 for the exam, diagnosis, procedures, and orders are all accurate and complete.

## 2021-03-07 DIAGNOSIS — F320 Major depressive disorder, single episode, mild: Secondary | ICD-10-CM | POA: Diagnosis not present

## 2021-03-07 DIAGNOSIS — E7800 Pure hypercholesterolemia, unspecified: Secondary | ICD-10-CM | POA: Diagnosis not present

## 2021-03-07 DIAGNOSIS — K219 Gastro-esophageal reflux disease without esophagitis: Secondary | ICD-10-CM | POA: Diagnosis not present

## 2021-03-07 DIAGNOSIS — M8580 Other specified disorders of bone density and structure, unspecified site: Secondary | ICD-10-CM | POA: Diagnosis not present

## 2021-03-25 DIAGNOSIS — Z23 Encounter for immunization: Secondary | ICD-10-CM | POA: Diagnosis not present

## 2021-03-27 DIAGNOSIS — G43719 Chronic migraine without aura, intractable, without status migrainosus: Secondary | ICD-10-CM | POA: Diagnosis not present

## 2021-03-27 DIAGNOSIS — G43019 Migraine without aura, intractable, without status migrainosus: Secondary | ICD-10-CM | POA: Diagnosis not present

## 2021-04-15 DIAGNOSIS — J029 Acute pharyngitis, unspecified: Secondary | ICD-10-CM | POA: Diagnosis not present

## 2021-04-15 DIAGNOSIS — Z03818 Encounter for observation for suspected exposure to other biological agents ruled out: Secondary | ICD-10-CM | POA: Diagnosis not present

## 2021-04-15 DIAGNOSIS — R0982 Postnasal drip: Secondary | ICD-10-CM | POA: Diagnosis not present

## 2021-04-15 DIAGNOSIS — R110 Nausea: Secondary | ICD-10-CM | POA: Diagnosis not present

## 2021-04-15 DIAGNOSIS — R051 Acute cough: Secondary | ICD-10-CM | POA: Diagnosis not present

## 2021-04-29 ENCOUNTER — Other Ambulatory Visit: Payer: Self-pay | Admitting: Internal Medicine

## 2021-04-29 ENCOUNTER — Ambulatory Visit
Admission: RE | Admit: 2021-04-29 | Discharge: 2021-04-29 | Disposition: A | Discharge status: Home / Self Care | Payer: Medicare Other | Source: Ambulatory Visit | Attending: Internal Medicine | Admitting: Internal Medicine

## 2021-04-29 DIAGNOSIS — M25552 Pain in left hip: Secondary | ICD-10-CM | POA: Diagnosis not present

## 2021-04-29 DIAGNOSIS — B353 Tinea pedis: Secondary | ICD-10-CM | POA: Diagnosis not present

## 2021-05-10 DIAGNOSIS — Z1231 Encounter for screening mammogram for malignant neoplasm of breast: Secondary | ICD-10-CM | POA: Diagnosis not present

## 2021-05-10 DIAGNOSIS — M8589 Other specified disorders of bone density and structure, multiple sites: Secondary | ICD-10-CM | POA: Diagnosis not present

## 2021-05-17 DIAGNOSIS — M8580 Other specified disorders of bone density and structure, unspecified site: Secondary | ICD-10-CM | POA: Diagnosis not present

## 2021-05-17 DIAGNOSIS — Z780 Asymptomatic menopausal state: Secondary | ICD-10-CM | POA: Diagnosis not present

## 2021-05-29 ENCOUNTER — Ambulatory Visit: Payer: Medicare Other | Admitting: Dermatology

## 2021-06-25 DIAGNOSIS — Z961 Presence of intraocular lens: Secondary | ICD-10-CM | POA: Diagnosis not present

## 2021-06-25 DIAGNOSIS — H18413 Arcus senilis, bilateral: Secondary | ICD-10-CM | POA: Diagnosis not present

## 2021-06-25 DIAGNOSIS — H04123 Dry eye syndrome of bilateral lacrimal glands: Secondary | ICD-10-CM | POA: Diagnosis not present

## 2021-06-25 DIAGNOSIS — H353131 Nonexudative age-related macular degeneration, bilateral, early dry stage: Secondary | ICD-10-CM | POA: Diagnosis not present

## 2021-06-26 ENCOUNTER — Ambulatory Visit: Payer: Medicare Other | Admitting: Dermatology

## 2021-07-14 DIAGNOSIS — Z20822 Contact with and (suspected) exposure to covid-19: Secondary | ICD-10-CM | POA: Diagnosis not present

## 2021-08-01 DIAGNOSIS — G4733 Obstructive sleep apnea (adult) (pediatric): Secondary | ICD-10-CM | POA: Diagnosis not present

## 2021-08-09 DIAGNOSIS — E7800 Pure hypercholesterolemia, unspecified: Secondary | ICD-10-CM | POA: Diagnosis not present

## 2021-08-09 DIAGNOSIS — R519 Headache, unspecified: Secondary | ICD-10-CM | POA: Diagnosis not present

## 2021-08-09 DIAGNOSIS — Z0000 Encounter for general adult medical examination without abnormal findings: Secondary | ICD-10-CM | POA: Diagnosis not present

## 2021-08-09 DIAGNOSIS — F320 Major depressive disorder, single episode, mild: Secondary | ICD-10-CM | POA: Diagnosis not present

## 2021-08-09 DIAGNOSIS — K219 Gastro-esophageal reflux disease without esophagitis: Secondary | ICD-10-CM | POA: Diagnosis not present

## 2021-08-09 DIAGNOSIS — Z23 Encounter for immunization: Secondary | ICD-10-CM | POA: Diagnosis not present

## 2021-08-09 DIAGNOSIS — M8580 Other specified disorders of bone density and structure, unspecified site: Secondary | ICD-10-CM | POA: Diagnosis not present

## 2021-08-09 DIAGNOSIS — R030 Elevated blood-pressure reading, without diagnosis of hypertension: Secondary | ICD-10-CM | POA: Diagnosis not present

## 2021-08-09 DIAGNOSIS — Z79899 Other long term (current) drug therapy: Secondary | ICD-10-CM | POA: Diagnosis not present

## 2021-08-09 DIAGNOSIS — E559 Vitamin D deficiency, unspecified: Secondary | ICD-10-CM | POA: Diagnosis not present

## 2021-08-09 DIAGNOSIS — G4733 Obstructive sleep apnea (adult) (pediatric): Secondary | ICD-10-CM | POA: Diagnosis not present

## 2021-08-26 DIAGNOSIS — Z20822 Contact with and (suspected) exposure to covid-19: Secondary | ICD-10-CM | POA: Diagnosis not present

## 2021-08-26 DIAGNOSIS — G43719 Chronic migraine without aura, intractable, without status migrainosus: Secondary | ICD-10-CM | POA: Diagnosis not present

## 2021-08-26 DIAGNOSIS — G43019 Migraine without aura, intractable, without status migrainosus: Secondary | ICD-10-CM | POA: Diagnosis not present

## 2021-09-17 DIAGNOSIS — Z03818 Encounter for observation for suspected exposure to other biological agents ruled out: Secondary | ICD-10-CM | POA: Diagnosis not present

## 2021-09-17 DIAGNOSIS — J029 Acute pharyngitis, unspecified: Secondary | ICD-10-CM | POA: Diagnosis not present

## 2021-09-17 DIAGNOSIS — R051 Acute cough: Secondary | ICD-10-CM | POA: Diagnosis not present

## 2021-09-23 DIAGNOSIS — Z20822 Contact with and (suspected) exposure to covid-19: Secondary | ICD-10-CM | POA: Diagnosis not present

## 2021-10-01 ENCOUNTER — Ambulatory Visit: Payer: Medicare Other | Admitting: Dermatology

## 2021-10-14 DIAGNOSIS — G43719 Chronic migraine without aura, intractable, without status migrainosus: Secondary | ICD-10-CM | POA: Diagnosis not present

## 2021-10-14 DIAGNOSIS — G43019 Migraine without aura, intractable, without status migrainosus: Secondary | ICD-10-CM | POA: Diagnosis not present

## 2021-10-22 DIAGNOSIS — Z20822 Contact with and (suspected) exposure to covid-19: Secondary | ICD-10-CM | POA: Diagnosis not present

## 2021-10-27 DIAGNOSIS — Z20822 Contact with and (suspected) exposure to covid-19: Secondary | ICD-10-CM | POA: Diagnosis not present

## 2021-11-06 DIAGNOSIS — Z23 Encounter for immunization: Secondary | ICD-10-CM | POA: Diagnosis not present

## 2021-11-11 ENCOUNTER — Encounter: Payer: Self-pay | Admitting: Dermatology

## 2021-11-11 ENCOUNTER — Ambulatory Visit (INDEPENDENT_AMBULATORY_CARE_PROVIDER_SITE_OTHER): Payer: Medicare Other | Admitting: Dermatology

## 2021-11-11 DIAGNOSIS — R208 Other disturbances of skin sensation: Secondary | ICD-10-CM | POA: Diagnosis not present

## 2021-11-11 DIAGNOSIS — Z1283 Encounter for screening for malignant neoplasm of skin: Secondary | ICD-10-CM | POA: Diagnosis not present

## 2021-11-11 DIAGNOSIS — D485 Neoplasm of uncertain behavior of skin: Secondary | ICD-10-CM

## 2021-11-11 DIAGNOSIS — L821 Other seborrheic keratosis: Secondary | ICD-10-CM

## 2021-11-11 DIAGNOSIS — L570 Actinic keratosis: Secondary | ICD-10-CM | POA: Diagnosis not present

## 2021-11-11 NOTE — Patient Instructions (Signed)

## 2021-11-15 DIAGNOSIS — Z20822 Contact with and (suspected) exposure to covid-19: Secondary | ICD-10-CM | POA: Diagnosis not present

## 2021-11-18 DIAGNOSIS — G43719 Chronic migraine without aura, intractable, without status migrainosus: Secondary | ICD-10-CM | POA: Diagnosis not present

## 2021-11-18 DIAGNOSIS — G43019 Migraine without aura, intractable, without status migrainosus: Secondary | ICD-10-CM | POA: Diagnosis not present

## 2021-11-29 ENCOUNTER — Encounter: Payer: Self-pay | Admitting: Dermatology

## 2021-11-29 NOTE — Progress Notes (Signed)
   Follow-Up Visit   Subjective  Sara Le is a 76 y.o. female who presents for the following: Annual Exam (No new concerns).  Annual skin examination, growth spot on right shoulder Location:  Duration:  Quality:  Associated Signs/Symptoms: Modifying Factors:  Severity:  Timing: Context:   Objective  Well appearing patient in no apparent distress; mood and affect are within normal limits. Cutaneous Dysesthesia  Right Lower Back Full body skin exam.  No atypical pigmented lesions (all checked with dermoscopy).  1 possible nonmelanoma skin cancer right inner shoulder will be biopsied.  Neck - Anterior 8 mm waxy pink crust       Head - Anterior (Face), Right Malar Cheek Erythematous patches with gritty scale.    A full examination was performed including scalp, head, eyes, ears, nose, lips, neck, chest, axillae, abdomen, back, buttocks, bilateral upper extremities, bilateral lower extremities, hands, feet, fingers, toes, fingernails, and toenails. All findings within normal limits unless otherwise noted below.  Areas beneath undergarments not fully examined.   Assessment & Plan    Screening for malignant neoplasm of skin Right Lower Back  Yearly skin exams.  Neoplasm of uncertain behavior of skin Neck - Anterior  Skin / nail biopsy Type of biopsy: tangential   Informed consent: discussed and consent obtained   Timeout: patient name, date of birth, surgical site, and procedure verified   Anesthesia: the lesion was anesthetized in a standard fashion   Anesthetic:  1% lidocaine w/ epinephrine 1-100,000 local infiltration Instrument used: flexible razor blade   Hemostasis achieved with: ferric subsulfate   Outcome: patient tolerated procedure well   Post-procedure details: wound care instructions given    Destruction of lesion Complexity: simple   Destruction method: electrodesiccation and curettage   Informed consent: discussed and consent obtained    Timeout:  patient name, date of birth, surgical site, and procedure verified Anesthesia: the lesion was anesthetized in a standard fashion   Anesthetic:  1% lidocaine w/ epinephrine 1-100,000 local infiltration Curettage performed in three different directions: Yes   Curettage cycles:  3 Lesion length (cm):  0.6 Lesion width (cm):  0.6 Margin per side (cm):  0 Final wound size (cm):  0.6 Hemostasis achieved with:  aluminum chloride Outcome: patient tolerated procedure well with no complications   Post-procedure details: wound care instructions given    Specimen 1 - Surgical pathology Differential Diagnosis: scc vs bcc  Check Margins: No  After shave biopsy the base was treated with curettage plus cautery  AK (actinic keratosis) (2) Head - Anterior (Face); Right Malar Cheek  Destruction of lesion - Head - Anterior (Face), Right Malar Cheek Complexity: simple   Destruction method: cryotherapy   Informed consent: discussed and consent obtained   Timeout:  patient name, date of birth, surgical site, and procedure verified Lesion destroyed using liquid nitrogen: Yes   Cryotherapy cycles:  1 Outcome: patient tolerated procedure well with no complications   Post-procedure details: wound care instructions given    Seborrheic keratosis (2) Left Abdomen (side) - Upper; Right Abdomen (side) - Upper  Leave if stable  Dysesthesia      I, Lavonna Monarch, MD, have reviewed all documentation for this visit.  The documentation on 11/29/21 for the exam, diagnosis, procedures, and orders are all accurate and complete.

## 2022-01-28 DIAGNOSIS — G43719 Chronic migraine without aura, intractable, without status migrainosus: Secondary | ICD-10-CM | POA: Diagnosis not present

## 2022-01-28 DIAGNOSIS — G43019 Migraine without aura, intractable, without status migrainosus: Secondary | ICD-10-CM | POA: Diagnosis not present

## 2022-02-03 ENCOUNTER — Ambulatory Visit (INDEPENDENT_AMBULATORY_CARE_PROVIDER_SITE_OTHER): Payer: Medicare Other

## 2022-02-03 ENCOUNTER — Ambulatory Visit (INDEPENDENT_AMBULATORY_CARE_PROVIDER_SITE_OTHER): Payer: Medicare Other | Admitting: Podiatry

## 2022-02-03 DIAGNOSIS — M778 Other enthesopathies, not elsewhere classified: Secondary | ICD-10-CM | POA: Diagnosis not present

## 2022-02-03 DIAGNOSIS — M7742 Metatarsalgia, left foot: Secondary | ICD-10-CM | POA: Diagnosis not present

## 2022-02-03 DIAGNOSIS — M7741 Metatarsalgia, right foot: Secondary | ICD-10-CM | POA: Diagnosis not present

## 2022-02-03 DIAGNOSIS — M79671 Pain in right foot: Secondary | ICD-10-CM

## 2022-02-03 DIAGNOSIS — B353 Tinea pedis: Secondary | ICD-10-CM | POA: Diagnosis not present

## 2022-02-03 NOTE — Patient Instructions (Addendum)
You can use VOLTAREN GEL on the foot as needed.   Look at getting "Oofos" for the sandals to help decrease pressure on the foot.

## 2022-02-03 NOTE — Progress Notes (Unsigned)
Subjective:   Patient ID: Sara Le, female   DOB: 76 y.o.   MRN: [id number]   HPI Chief Complaint  Patient presents with   Foot Pain    Pt came in today for right foot pain, Started 15 years ago, the ball of the foot has been hurting, pain rate is a 6 out of 10, no padding on the bottom of the foot, Seen Dr Little Ishikawa years ago was told it was capulisitis  and itching in both feet    76 year old female presents with the above complaints. She tried a metatarsal bar on her she which did not help. She was using a rocker style shoe which helped. She is wearing orthofeet shoes which help some. No swelling, numbness, numbness/tingling. She tries to walk most mornings and if she skips for a few days it feels better. When she walks every day it hurts more. Hurts when she is walking and the "foot comes down". Over the last 6 months it has become more consistent. No pain on the left foot.   She gets itching which has been ongoing for years. Occurs on the left foot 3rd and 4th interspaces. She has seen doctors for this and no signs of athletes foot. She has tried [employer]. Ketoconazole helps.    Review of Systems  All other systems reviewed and are negative.   Past Medical History:  Diagnosis Date   Arthritis    general arthritis   Basal cell carcinoma 11/132017   LEFT TEMPLE CX3 5FU   Cancer ([place])    Right breast cancer- s/p right mastectomy -19 yrs ago   Dry eyes, bilateral    Restasis use daily   GERD (gastroesophageal reflux disease)    H/O urinary frequency    Headache    migraines- much milder now   Sleep apnea    cpap bedtime- Dr. Felipa Eth, PCP follows    Past Surgical History:  Procedure Laterality Date   APPENDECTOMY     laparoscopic   BREAST SURGERY      multiple needle biopsiesin past.   CATARACT EXTRACTION, BILATERAL     COLONOSCOPY WITH PROPOFOL N/A 05/13/2016   Procedure: COLONOSCOPY WITH PROPOFOL;  Surgeon: Garlan Fair, MD;  Location: [facility];  Service:  Endoscopy;  Laterality: N/A;   MASTECTOMY Right    Mastectomy   RECONSTRUCTION BREAST W/ TRAM FLAP Right    Right breast tram flap   TONSILLECTOMY     child     Current Outpatient Medications:    atorvastatin (LIPITOR) 10 MG tablet, Take 10 mg by mouth at bedtime., Disp: , Rfl:    b complex vitamins tablet, Take 1 tablet by mouth 2 (two) times daily., Disp: , Rfl:    calcium-vitamin D (OSCAL WITH D) 500-200 MG-UNIT tablet, Take 1 tablet by mouth daily., Disp: , Rfl:    co-enzyme Q-10 30 MG capsule, Take 100 mg by mouth daily., Disp: , Rfl:    cyclobenzaprine (FLEXERIL) 10 MG tablet, Take 10 mg by mouth 3 (three) times daily as needed for muscle spasms., Disp: , Rfl:    cycloSPORINE (RESTASIS) 0.05 % ophthalmic emulsion, Place 1 drop into both eyes 2 (two) times daily., Disp: , Rfl:    magnesium gluconate (MAGONATE) 500 MG tablet, Take 500 mg by mouth 2 (two) times daily., Disp: , Rfl:    magnesium oxide (MAG-OX) 400 MG tablet, Take 400 mg by mouth daily., Disp: , Rfl:    sertraline (ZOLOFT) 25 MG tablet, Take 25 mg  by mouth daily., Disp: , Rfl:    Vitamin D, Ergocalciferol, (DRISDOL) 50000 units CAPS capsule, Take 50,000 Units by mouth every 14 (fourteen) days. Tuesday( every other week), Disp: , Rfl:   Allergies  Allergen Reactions   Adhesive [Tape]     Rash -prolong use         Objective:  Physical Exam  General: AAO x3, NAD  Dermatological: Skin is warm, dry and supple bilateral.  Unable to appreciate any significant tinea pedis or any open sores or dry skin on the area of itching between the toes today.  There are no open sores, no preulcerative lesions, no rash or signs of infection present.  Vascular: Dorsalis Pedis artery and Posterior Tibial artery pedal pulses are 2/4 bilateral with immedate capillary fill time. There is no pain with calf compression, swelling, warmth, erythema.   Neruologic: Grossly intact via light touch bilateral.   Musculoskeletal: Prominent  metatarsal heads plantarly with diffuse on the submetatarsal area consistent with metatarsalgia.  No area pinpoint tenderness.  No pain with imaging range of motion.  MMT 5/5.    Gait: Unassisted, Nonantalgic.       Assessment:   Metatarsalgia, likely tinea pedis     Plan:  -Treatment options discussed including all alternatives, risks, and complications -Etiology of symptoms were discussed -X-rays were obtained and reviewed with the patient.  -Orthotics-discussed custom versus over-the-counter inserts.  She was proceed with custom inserts and will add a metatarsal pad and cushioning. -Discussed other types of shoes to help with pressure, support. -Voltaren gel as needed -Continue offloading  -Ketoconazole - has had home   Trula Slade DPM

## 2022-02-24 ENCOUNTER — Ambulatory Visit (INDEPENDENT_AMBULATORY_CARE_PROVIDER_SITE_OTHER): Payer: Medicare Other

## 2022-02-24 DIAGNOSIS — M7741 Metatarsalgia, right foot: Secondary | ICD-10-CM

## 2022-02-24 DIAGNOSIS — M7742 Metatarsalgia, left foot: Secondary | ICD-10-CM

## 2022-02-26 NOTE — Progress Notes (Signed)
Orthotics were dispensed and fit was satisfactory. Reviewed instructions for break-in and wear. Written instructions given to patient.  Patient will follow up as needed.   [facility] - order # [id number]

## 2022-03-13 DIAGNOSIS — G43719 Chronic migraine without aura, intractable, without status migrainosus: Secondary | ICD-10-CM | POA: Diagnosis not present

## 2022-04-09 ENCOUNTER — Other Ambulatory Visit: Payer: Self-pay | Admitting: Podiatry

## 2022-04-09 DIAGNOSIS — M7741 Metatarsalgia, right foot: Secondary | ICD-10-CM

## 2022-04-09 DIAGNOSIS — Z23 Encounter for immunization: Secondary | ICD-10-CM | POA: Diagnosis not present

## 2022-04-12 DIAGNOSIS — Z23 Encounter for immunization: Secondary | ICD-10-CM | POA: Diagnosis not present

## 2022-05-07 DIAGNOSIS — G43719 Chronic migraine without aura, intractable, without status migrainosus: Secondary | ICD-10-CM | POA: Diagnosis not present

## 2022-05-14 ENCOUNTER — Ambulatory Visit: Payer: Medicare Other | Admitting: Dermatology

## 2022-05-24 DIAGNOSIS — Z1231 Encounter for screening mammogram for malignant neoplasm of breast: Secondary | ICD-10-CM | POA: Diagnosis not present

## 2022-05-29 DIAGNOSIS — G4733 Obstructive sleep apnea (adult) (pediatric): Secondary | ICD-10-CM | POA: Diagnosis not present

## 2022-06-24 DIAGNOSIS — H18413 Arcus senilis, bilateral: Secondary | ICD-10-CM | POA: Diagnosis not present

## 2022-06-24 DIAGNOSIS — Z961 Presence of intraocular lens: Secondary | ICD-10-CM | POA: Diagnosis not present

## 2022-06-24 DIAGNOSIS — H04123 Dry eye syndrome of bilateral lacrimal glands: Secondary | ICD-10-CM | POA: Diagnosis not present

## 2022-06-24 DIAGNOSIS — H353131 Nonexudative age-related macular degeneration, bilateral, early dry stage: Secondary | ICD-10-CM | POA: Diagnosis not present

## 2022-08-06 DIAGNOSIS — R051 Acute cough: Secondary | ICD-10-CM | POA: Diagnosis not present

## 2022-08-06 DIAGNOSIS — R197 Diarrhea, unspecified: Secondary | ICD-10-CM | POA: Diagnosis not present

## 2022-08-13 DIAGNOSIS — Z0000 Encounter for general adult medical examination without abnormal findings: Secondary | ICD-10-CM | POA: Diagnosis not present

## 2022-08-13 DIAGNOSIS — Z1331 Encounter for screening for depression: Secondary | ICD-10-CM | POA: Diagnosis not present

## 2022-08-19 DIAGNOSIS — G43719 Chronic migraine without aura, intractable, without status migrainosus: Secondary | ICD-10-CM | POA: Diagnosis not present

## 2022-09-03 DIAGNOSIS — L814 Other melanin hyperpigmentation: Secondary | ICD-10-CM | POA: Diagnosis not present

## 2022-09-03 DIAGNOSIS — D1801 Hemangioma of skin and subcutaneous tissue: Secondary | ICD-10-CM | POA: Diagnosis not present

## 2022-09-03 DIAGNOSIS — L821 Other seborrheic keratosis: Secondary | ICD-10-CM | POA: Diagnosis not present

## 2022-09-03 DIAGNOSIS — L218 Other seborrheic dermatitis: Secondary | ICD-10-CM | POA: Diagnosis not present

## 2022-09-03 DIAGNOSIS — L570 Actinic keratosis: Secondary | ICD-10-CM | POA: Diagnosis not present

## 2022-09-03 DIAGNOSIS — D225 Melanocytic nevi of trunk: Secondary | ICD-10-CM | POA: Diagnosis not present

## 2022-09-05 DIAGNOSIS — R030 Elevated blood-pressure reading, without diagnosis of hypertension: Secondary | ICD-10-CM | POA: Diagnosis not present

## 2022-09-05 DIAGNOSIS — G4733 Obstructive sleep apnea (adult) (pediatric): Secondary | ICD-10-CM | POA: Diagnosis not present

## 2022-09-05 DIAGNOSIS — E7800 Pure hypercholesterolemia, unspecified: Secondary | ICD-10-CM | POA: Diagnosis not present

## 2022-09-05 DIAGNOSIS — R519 Headache, unspecified: Secondary | ICD-10-CM | POA: Diagnosis not present

## 2022-09-05 DIAGNOSIS — E559 Vitamin D deficiency, unspecified: Secondary | ICD-10-CM | POA: Diagnosis not present

## 2022-09-05 DIAGNOSIS — M8580 Other specified disorders of bone density and structure, unspecified site: Secondary | ICD-10-CM | POA: Diagnosis not present

## 2022-09-05 DIAGNOSIS — E663 Overweight: Secondary | ICD-10-CM | POA: Diagnosis not present

## 2022-09-05 DIAGNOSIS — Z79899 Other long term (current) drug therapy: Secondary | ICD-10-CM | POA: Diagnosis not present

## 2022-09-05 DIAGNOSIS — F320 Major depressive disorder, single episode, mild: Secondary | ICD-10-CM | POA: Diagnosis not present

## 2022-09-05 DIAGNOSIS — K219 Gastro-esophageal reflux disease without esophagitis: Secondary | ICD-10-CM | POA: Diagnosis not present

## 2022-09-08 DIAGNOSIS — M8580 Other specified disorders of bone density and structure, unspecified site: Secondary | ICD-10-CM | POA: Diagnosis not present

## 2022-09-08 DIAGNOSIS — M8589 Other specified disorders of bone density and structure, multiple sites: Secondary | ICD-10-CM | POA: Diagnosis not present

## 2022-09-08 DIAGNOSIS — E7800 Pure hypercholesterolemia, unspecified: Secondary | ICD-10-CM | POA: Diagnosis not present

## 2022-09-13 DIAGNOSIS — Z23 Encounter for immunization: Secondary | ICD-10-CM | POA: Diagnosis not present

## 2022-10-06 DIAGNOSIS — E559 Vitamin D deficiency, unspecified: Secondary | ICD-10-CM | POA: Diagnosis not present

## 2022-10-06 DIAGNOSIS — G4733 Obstructive sleep apnea (adult) (pediatric): Secondary | ICD-10-CM | POA: Diagnosis not present

## 2022-10-06 DIAGNOSIS — E7800 Pure hypercholesterolemia, unspecified: Secondary | ICD-10-CM | POA: Diagnosis not present

## 2022-10-06 DIAGNOSIS — Z713 Dietary counseling and surveillance: Secondary | ICD-10-CM | POA: Diagnosis not present

## 2022-10-06 DIAGNOSIS — M8580 Other specified disorders of bone density and structure, unspecified site: Secondary | ICD-10-CM | POA: Diagnosis not present

## 2022-10-16 DIAGNOSIS — J019 Acute sinusitis, unspecified: Secondary | ICD-10-CM | POA: Diagnosis not present

## 2022-10-20 ENCOUNTER — Other Ambulatory Visit: Payer: Self-pay | Admitting: Physician Assistant

## 2022-10-20 ENCOUNTER — Ambulatory Visit
Admission: RE | Admit: 2022-10-20 | Discharge: 2022-10-20 | Disposition: A | Discharge status: Home / Self Care | Payer: Medicare Other | Source: Ambulatory Visit | Attending: Physician Assistant | Admitting: Physician Assistant

## 2022-10-20 DIAGNOSIS — J208 Acute bronchitis due to other specified organisms: Secondary | ICD-10-CM | POA: Diagnosis not present

## 2022-10-20 DIAGNOSIS — R059 Cough, unspecified: Secondary | ICD-10-CM | POA: Diagnosis not present

## 2022-10-20 DIAGNOSIS — R051 Acute cough: Secondary | ICD-10-CM

## 2022-10-20 DIAGNOSIS — B9689 Other specified bacterial agents as the cause of diseases classified elsewhere: Secondary | ICD-10-CM | POA: Diagnosis not present

## 2022-10-20 DIAGNOSIS — R0602 Shortness of breath: Secondary | ICD-10-CM | POA: Diagnosis not present

## 2022-11-26 DIAGNOSIS — K219 Gastro-esophageal reflux disease without esophagitis: Secondary | ICD-10-CM | POA: Diagnosis not present

## 2022-11-26 DIAGNOSIS — E7800 Pure hypercholesterolemia, unspecified: Secondary | ICD-10-CM | POA: Diagnosis not present

## 2022-11-26 DIAGNOSIS — G4733 Obstructive sleep apnea (adult) (pediatric): Secondary | ICD-10-CM | POA: Diagnosis not present

## 2022-11-26 DIAGNOSIS — Z713 Dietary counseling and surveillance: Secondary | ICD-10-CM | POA: Diagnosis not present

## 2022-11-26 DIAGNOSIS — E559 Vitamin D deficiency, unspecified: Secondary | ICD-10-CM | POA: Diagnosis not present

## 2022-12-03 DIAGNOSIS — M791 Myalgia, unspecified site: Secondary | ICD-10-CM | POA: Diagnosis not present

## 2022-12-03 DIAGNOSIS — E7800 Pure hypercholesterolemia, unspecified: Secondary | ICD-10-CM | POA: Diagnosis not present

## 2022-12-03 DIAGNOSIS — M8580 Other specified disorders of bone density and structure, unspecified site: Secondary | ICD-10-CM | POA: Diagnosis not present

## 2022-12-03 DIAGNOSIS — F320 Major depressive disorder, single episode, mild: Secondary | ICD-10-CM | POA: Diagnosis not present

## 2022-12-03 DIAGNOSIS — E559 Vitamin D deficiency, unspecified: Secondary | ICD-10-CM | POA: Diagnosis not present

## 2022-12-03 DIAGNOSIS — G4733 Obstructive sleep apnea (adult) (pediatric): Secondary | ICD-10-CM | POA: Diagnosis not present

## 2022-12-03 DIAGNOSIS — R030 Elevated blood-pressure reading, without diagnosis of hypertension: Secondary | ICD-10-CM | POA: Diagnosis not present

## 2023-01-07 DIAGNOSIS — E7800 Pure hypercholesterolemia, unspecified: Secondary | ICD-10-CM | POA: Diagnosis not present

## 2023-01-07 DIAGNOSIS — G4733 Obstructive sleep apnea (adult) (pediatric): Secondary | ICD-10-CM | POA: Diagnosis not present

## 2023-01-07 DIAGNOSIS — E559 Vitamin D deficiency, unspecified: Secondary | ICD-10-CM | POA: Diagnosis not present

## 2023-01-07 DIAGNOSIS — Z713 Dietary counseling and surveillance: Secondary | ICD-10-CM | POA: Diagnosis not present

## 2023-02-25 DIAGNOSIS — M25552 Pain in left hip: Secondary | ICD-10-CM | POA: Diagnosis not present

## 2023-02-25 DIAGNOSIS — M791 Myalgia, unspecified site: Secondary | ICD-10-CM | POA: Diagnosis not present

## 2023-02-25 DIAGNOSIS — M542 Cervicalgia: Secondary | ICD-10-CM | POA: Diagnosis not present

## 2023-03-12 DIAGNOSIS — M7062 Trochanteric bursitis, left hip: Secondary | ICD-10-CM | POA: Diagnosis not present

## 2023-03-12 DIAGNOSIS — M222X1 Patellofemoral disorders, right knee: Secondary | ICD-10-CM | POA: Diagnosis not present

## 2023-03-12 DIAGNOSIS — M25561 Pain in right knee: Secondary | ICD-10-CM | POA: Diagnosis not present

## 2023-03-12 DIAGNOSIS — M25552 Pain in left hip: Secondary | ICD-10-CM | POA: Diagnosis not present

## 2023-03-25 DIAGNOSIS — Z23 Encounter for immunization: Secondary | ICD-10-CM | POA: Diagnosis not present

## 2023-03-26 DIAGNOSIS — Z23 Encounter for immunization: Secondary | ICD-10-CM | POA: Diagnosis not present

## 2023-04-02 DIAGNOSIS — M25552 Pain in left hip: Secondary | ICD-10-CM | POA: Diagnosis not present

## 2023-04-08 DIAGNOSIS — M25552 Pain in left hip: Secondary | ICD-10-CM | POA: Diagnosis not present

## 2023-04-16 DIAGNOSIS — M25552 Pain in left hip: Secondary | ICD-10-CM | POA: Diagnosis not present

## 2023-04-21 DIAGNOSIS — M25552 Pain in left hip: Secondary | ICD-10-CM | POA: Diagnosis not present

## 2023-04-23 DIAGNOSIS — M222X1 Patellofemoral disorders, right knee: Secondary | ICD-10-CM | POA: Diagnosis not present

## 2023-04-23 DIAGNOSIS — M7062 Trochanteric bursitis, left hip: Secondary | ICD-10-CM | POA: Diagnosis not present

## 2023-04-28 DIAGNOSIS — M25552 Pain in left hip: Secondary | ICD-10-CM | POA: Diagnosis not present

## 2023-05-02 DIAGNOSIS — Z23 Encounter for immunization: Secondary | ICD-10-CM | POA: Diagnosis not present

## 2023-05-20 DIAGNOSIS — G4733 Obstructive sleep apnea (adult) (pediatric): Secondary | ICD-10-CM | POA: Diagnosis not present

## 2023-05-22 DIAGNOSIS — M25552 Pain in left hip: Secondary | ICD-10-CM | POA: Diagnosis not present

## 2023-05-25 DIAGNOSIS — L821 Other seborrheic keratosis: Secondary | ICD-10-CM | POA: Diagnosis not present

## 2023-05-25 DIAGNOSIS — L720 Epidermal cyst: Secondary | ICD-10-CM | POA: Diagnosis not present

## 2023-05-30 DIAGNOSIS — Z1231 Encounter for screening mammogram for malignant neoplasm of breast: Secondary | ICD-10-CM | POA: Diagnosis not present

## 2023-06-25 DIAGNOSIS — Z961 Presence of intraocular lens: Secondary | ICD-10-CM | POA: Diagnosis not present

## 2023-06-25 DIAGNOSIS — H04123 Dry eye syndrome of bilateral lacrimal glands: Secondary | ICD-10-CM | POA: Diagnosis not present

## 2023-06-25 DIAGNOSIS — H353132 Nonexudative age-related macular degeneration, bilateral, intermediate dry stage: Secondary | ICD-10-CM | POA: Diagnosis not present

## 2023-06-25 DIAGNOSIS — H18413 Arcus senilis, bilateral: Secondary | ICD-10-CM | POA: Diagnosis not present

## 2023-08-05 DIAGNOSIS — L03119 Cellulitis of unspecified part of limb: Secondary | ICD-10-CM | POA: Diagnosis not present

## 2023-08-05 DIAGNOSIS — G4733 Obstructive sleep apnea (adult) (pediatric): Secondary | ICD-10-CM | POA: Diagnosis not present

## 2023-08-05 DIAGNOSIS — I10 Essential (primary) hypertension: Secondary | ICD-10-CM | POA: Diagnosis not present

## 2023-09-10 ENCOUNTER — Other Ambulatory Visit (HOSPITAL_COMMUNITY): Payer: Self-pay | Admitting: Internal Medicine

## 2023-09-10 DIAGNOSIS — Z1331 Encounter for screening for depression: Secondary | ICD-10-CM | POA: Diagnosis not present

## 2023-09-10 DIAGNOSIS — E7800 Pure hypercholesterolemia, unspecified: Secondary | ICD-10-CM | POA: Diagnosis not present

## 2023-09-10 DIAGNOSIS — E559 Vitamin D deficiency, unspecified: Secondary | ICD-10-CM | POA: Diagnosis not present

## 2023-09-10 DIAGNOSIS — M8580 Other specified disorders of bone density and structure, unspecified site: Secondary | ICD-10-CM | POA: Diagnosis not present

## 2023-09-10 DIAGNOSIS — Z79899 Other long term (current) drug therapy: Secondary | ICD-10-CM | POA: Diagnosis not present

## 2023-09-10 DIAGNOSIS — R519 Headache, unspecified: Secondary | ICD-10-CM | POA: Diagnosis not present

## 2023-09-10 DIAGNOSIS — Z0000 Encounter for general adult medical examination without abnormal findings: Secondary | ICD-10-CM | POA: Diagnosis not present

## 2023-09-10 DIAGNOSIS — R600 Localized edema: Secondary | ICD-10-CM | POA: Diagnosis not present

## 2023-09-10 DIAGNOSIS — F320 Major depressive disorder, single episode, mild: Secondary | ICD-10-CM | POA: Diagnosis not present

## 2023-09-10 DIAGNOSIS — R5382 Chronic fatigue, unspecified: Secondary | ICD-10-CM | POA: Diagnosis not present

## 2023-09-10 DIAGNOSIS — R030 Elevated blood-pressure reading, without diagnosis of hypertension: Secondary | ICD-10-CM | POA: Diagnosis not present

## 2023-09-10 DIAGNOSIS — K219 Gastro-esophageal reflux disease without esophagitis: Secondary | ICD-10-CM | POA: Diagnosis not present

## 2023-09-10 DIAGNOSIS — G4733 Obstructive sleep apnea (adult) (pediatric): Secondary | ICD-10-CM | POA: Diagnosis not present

## 2023-09-10 DIAGNOSIS — M25552 Pain in left hip: Secondary | ICD-10-CM | POA: Diagnosis not present

## 2023-09-15 ENCOUNTER — Encounter (HOSPITAL_COMMUNITY): Payer: Self-pay

## 2023-09-15 ENCOUNTER — Ambulatory Visit (HOSPITAL_COMMUNITY)
Admission: RE | Admit: 2023-09-15 | Discharge: 2023-09-15 | Disposition: A | Discharge status: Home / Self Care | Payer: Self-pay | Source: Ambulatory Visit | Attending: Internal Medicine | Admitting: Internal Medicine

## 2023-09-15 DIAGNOSIS — E7800 Pure hypercholesterolemia, unspecified: Secondary | ICD-10-CM | POA: Insufficient documentation

## 2023-09-21 DIAGNOSIS — I10 Essential (primary) hypertension: Secondary | ICD-10-CM | POA: Diagnosis not present

## 2023-10-12 DIAGNOSIS — I10 Essential (primary) hypertension: Secondary | ICD-10-CM | POA: Diagnosis not present

## 2023-10-12 DIAGNOSIS — E663 Overweight: Secondary | ICD-10-CM | POA: Diagnosis not present

## 2023-10-12 DIAGNOSIS — F320 Major depressive disorder, single episode, mild: Secondary | ICD-10-CM | POA: Diagnosis not present

## 2023-10-12 DIAGNOSIS — E7800 Pure hypercholesterolemia, unspecified: Secondary | ICD-10-CM | POA: Diagnosis not present

## 2023-10-20 DIAGNOSIS — I10 Essential (primary) hypertension: Secondary | ICD-10-CM | POA: Diagnosis not present

## 2023-10-28 ENCOUNTER — Encounter (INDEPENDENT_AMBULATORY_CARE_PROVIDER_SITE_OTHER): Payer: Self-pay

## 2023-11-11 DIAGNOSIS — F320 Major depressive disorder, single episode, mild: Secondary | ICD-10-CM | POA: Diagnosis not present

## 2023-11-11 DIAGNOSIS — I10 Essential (primary) hypertension: Secondary | ICD-10-CM | POA: Diagnosis not present

## 2023-11-11 DIAGNOSIS — E663 Overweight: Secondary | ICD-10-CM | POA: Diagnosis not present

## 2023-11-11 DIAGNOSIS — E7800 Pure hypercholesterolemia, unspecified: Secondary | ICD-10-CM | POA: Diagnosis not present

## 2023-11-17 DIAGNOSIS — Z79899 Other long term (current) drug therapy: Secondary | ICD-10-CM | POA: Diagnosis not present

## 2023-11-19 DIAGNOSIS — I10 Essential (primary) hypertension: Secondary | ICD-10-CM | POA: Diagnosis not present

## 2023-12-12 DIAGNOSIS — E7800 Pure hypercholesterolemia, unspecified: Secondary | ICD-10-CM | POA: Diagnosis not present

## 2023-12-12 DIAGNOSIS — E663 Overweight: Secondary | ICD-10-CM | POA: Diagnosis not present

## 2023-12-12 DIAGNOSIS — I10 Essential (primary) hypertension: Secondary | ICD-10-CM | POA: Diagnosis not present

## 2023-12-12 DIAGNOSIS — F320 Major depressive disorder, single episode, mild: Secondary | ICD-10-CM | POA: Diagnosis not present

## 2023-12-19 DIAGNOSIS — I10 Essential (primary) hypertension: Secondary | ICD-10-CM | POA: Diagnosis not present

## 2024-01-11 DIAGNOSIS — E663 Overweight: Secondary | ICD-10-CM | POA: Diagnosis not present

## 2024-01-11 DIAGNOSIS — I10 Essential (primary) hypertension: Secondary | ICD-10-CM | POA: Diagnosis not present

## 2024-01-11 DIAGNOSIS — F320 Major depressive disorder, single episode, mild: Secondary | ICD-10-CM | POA: Diagnosis not present

## 2024-01-11 DIAGNOSIS — E7800 Pure hypercholesterolemia, unspecified: Secondary | ICD-10-CM | POA: Diagnosis not present

## 2024-01-13 DIAGNOSIS — Z79899 Other long term (current) drug therapy: Secondary | ICD-10-CM | POA: Diagnosis not present

## 2024-01-13 DIAGNOSIS — Z789 Other specified health status: Secondary | ICD-10-CM | POA: Diagnosis not present

## 2024-01-18 DIAGNOSIS — I10 Essential (primary) hypertension: Secondary | ICD-10-CM | POA: Diagnosis not present

## 2024-02-11 DIAGNOSIS — E7800 Pure hypercholesterolemia, unspecified: Secondary | ICD-10-CM | POA: Diagnosis not present

## 2024-02-11 DIAGNOSIS — F320 Major depressive disorder, single episode, mild: Secondary | ICD-10-CM | POA: Diagnosis not present

## 2024-02-11 DIAGNOSIS — I10 Essential (primary) hypertension: Secondary | ICD-10-CM | POA: Diagnosis not present

## 2024-02-11 DIAGNOSIS — E663 Overweight: Secondary | ICD-10-CM | POA: Diagnosis not present

## 2024-02-15 DIAGNOSIS — M436 Torticollis: Secondary | ICD-10-CM | POA: Diagnosis not present

## 2024-02-15 DIAGNOSIS — J320 Chronic maxillary sinusitis: Secondary | ICD-10-CM | POA: Diagnosis not present

## 2024-02-17 DIAGNOSIS — I10 Essential (primary) hypertension: Secondary | ICD-10-CM | POA: Diagnosis not present

## 2024-03-01 DIAGNOSIS — R059 Cough, unspecified: Secondary | ICD-10-CM | POA: Diagnosis not present

## 2024-03-01 DIAGNOSIS — Z03818 Encounter for observation for suspected exposure to other biological agents ruled out: Secondary | ICD-10-CM | POA: Diagnosis not present

## 2024-03-01 DIAGNOSIS — M436 Torticollis: Secondary | ICD-10-CM | POA: Diagnosis not present

## 2024-03-01 DIAGNOSIS — M791 Myalgia, unspecified site: Secondary | ICD-10-CM | POA: Diagnosis not present

## 2024-03-01 DIAGNOSIS — J320 Chronic maxillary sinusitis: Secondary | ICD-10-CM | POA: Diagnosis not present

## 2024-03-13 DIAGNOSIS — F320 Major depressive disorder, single episode, mild: Secondary | ICD-10-CM | POA: Diagnosis not present

## 2024-03-13 DIAGNOSIS — E7800 Pure hypercholesterolemia, unspecified: Secondary | ICD-10-CM | POA: Diagnosis not present

## 2024-03-13 DIAGNOSIS — E663 Overweight: Secondary | ICD-10-CM | POA: Diagnosis not present

## 2024-03-13 DIAGNOSIS — I10 Essential (primary) hypertension: Secondary | ICD-10-CM | POA: Diagnosis not present

## 2024-03-18 DIAGNOSIS — I10 Essential (primary) hypertension: Secondary | ICD-10-CM | POA: Diagnosis not present

## 2024-03-31 DIAGNOSIS — Z23 Encounter for immunization: Secondary | ICD-10-CM | POA: Diagnosis not present

## 2024-04-12 DIAGNOSIS — F320 Major depressive disorder, single episode, mild: Secondary | ICD-10-CM | POA: Diagnosis not present

## 2024-04-12 DIAGNOSIS — E663 Overweight: Secondary | ICD-10-CM | POA: Diagnosis not present

## 2024-04-12 DIAGNOSIS — E7800 Pure hypercholesterolemia, unspecified: Secondary | ICD-10-CM | POA: Diagnosis not present

## 2024-04-12 DIAGNOSIS — I10 Essential (primary) hypertension: Secondary | ICD-10-CM | POA: Diagnosis not present

## 2024-04-17 DIAGNOSIS — I10 Essential (primary) hypertension: Secondary | ICD-10-CM | POA: Diagnosis not present

## 2024-04-19 DIAGNOSIS — L814 Other melanin hyperpigmentation: Secondary | ICD-10-CM | POA: Diagnosis not present

## 2024-04-19 DIAGNOSIS — D1801 Hemangioma of skin and subcutaneous tissue: Secondary | ICD-10-CM | POA: Diagnosis not present

## 2024-04-19 DIAGNOSIS — L820 Inflamed seborrheic keratosis: Secondary | ICD-10-CM | POA: Diagnosis not present

## 2024-04-19 DIAGNOSIS — L821 Other seborrheic keratosis: Secondary | ICD-10-CM | POA: Diagnosis not present

## 2024-05-02 DIAGNOSIS — H906 Mixed conductive and sensorineural hearing loss, bilateral: Secondary | ICD-10-CM | POA: Diagnosis not present

## 2024-05-05 ENCOUNTER — Encounter (INDEPENDENT_AMBULATORY_CARE_PROVIDER_SITE_OTHER): Payer: Self-pay

## 2024-05-07 ENCOUNTER — Other Ambulatory Visit: Payer: Self-pay | Admitting: Medical Genetics

## 2024-05-13 DIAGNOSIS — E663 Overweight: Secondary | ICD-10-CM | POA: Diagnosis not present

## 2024-05-13 DIAGNOSIS — I10 Essential (primary) hypertension: Secondary | ICD-10-CM | POA: Diagnosis not present

## 2024-05-13 DIAGNOSIS — F320 Major depressive disorder, single episode, mild: Secondary | ICD-10-CM | POA: Diagnosis not present

## 2024-05-13 DIAGNOSIS — E7800 Pure hypercholesterolemia, unspecified: Secondary | ICD-10-CM | POA: Diagnosis not present

## 2024-05-17 ENCOUNTER — Ambulatory Visit (INDEPENDENT_AMBULATORY_CARE_PROVIDER_SITE_OTHER): Admitting: Otolaryngology

## 2024-05-17 ENCOUNTER — Encounter (INDEPENDENT_AMBULATORY_CARE_PROVIDER_SITE_OTHER): Payer: Self-pay | Admitting: Otolaryngology

## 2024-05-17 VITALS — BP 164/74 | HR 68 | Ht 64.0 in | Wt 165.0 lb

## 2024-05-17 DIAGNOSIS — H608X3 Other otitis externa, bilateral: Secondary | ICD-10-CM | POA: Diagnosis not present

## 2024-05-17 DIAGNOSIS — I10 Essential (primary) hypertension: Secondary | ICD-10-CM | POA: Diagnosis not present

## 2024-05-17 DIAGNOSIS — R0981 Nasal congestion: Secondary | ICD-10-CM | POA: Diagnosis not present

## 2024-05-17 DIAGNOSIS — H6993 Unspecified Eustachian tube disorder, bilateral: Secondary | ICD-10-CM

## 2024-05-17 DIAGNOSIS — H906 Mixed conductive and sensorineural hearing loss, bilateral: Secondary | ICD-10-CM | POA: Diagnosis not present

## 2024-05-17 MED ORDER — FLUTICASONE PROPIONATE 50 MCG/ACT NA SUSP: 2.0000 | Freq: Every day | NASAL | 6 refills | Status: AC

## 2024-05-17 NOTE — Progress Notes (Signed)
 Dear Dr. Billy, Here is my assessment for our mutual patient, Sara Le. Thank you for allowing me the opportunity to care for your patient. Please do not hesitate to contact me should you have any other questions. Sincerely, Dr. Eldora Blanch  Otolaryngology Clinic Note Referring provider: Dr. Billy HPI:  Initial visit (05/2024): Discussed the use of AI scribe software for clinical note transcription with the patient, who gave verbal consent to proceed.  History of Present Illness Sara Le is a 78 year old female who presents with ear itching and hearing loss. She was referred by Dr. Greig Billy for evaluation of hearing loss.  She experiences persistent bilateral ear itching, relieved with hydrocortisone cream. She uses mineral oil before shampooing to prevent water from entering her ears. There is no ear drainage, tinnitus, or frequent vertigo. No history of ear trauma, surgery, or infections.  Noted subjective hearing loss, chronic, declining hearing/worsening. Went to [facility] for audio, which is as below A recent hearing test showed mixed hearing loss lower frequencies. She does experience a sensation of fullness in her ears, which she alleviates with sparing use of Q-tips.  She reports frequent nasal symptoms, including rhinorrhea and congesiton, which have persisted for 30 years. She uses saline nasal spray occasionally. No prior allergy test, no CRS sx. There is no facial weakness or numbness, sound or pressure-induced vertigo, and no history of ear surgery or infections.  Personal or FHx of bleeding dz or anesthesia difficulty: no  GLP-1: no AP/AC: no  Tobacco: no  PMHx: Arthritis, Breast Ca, GERD, Migraines, HLD  Independent Review of Additional Tests or Records:  [facility] Referral notes reviewed and uploaded or available in chart in media tab (05/02/2024): noted mixed HL, slight asymmetry and abnormal tymps; noted difficulty with speech in noise. No mniere's  hx 05/02/2024 Outside Audiogram was independently reviewed and interpreted by me and it reveals --- AD/AS: A/Ad tymps; WRT 100/92% at 65/75dB AHL; noted bilateral mixed HL with worse ABG lower frequnecies AS with ABG >30dB with AD ABG ~10dB HL; at higher frequencies noted mod SNHL  SNHL= Sensorineural hearing loss    PMH/Meds/All/SocHx/FamHx/ROS:   Past Medical History:  Diagnosis Date   Arthritis    general arthritis   Basal cell carcinoma 11/132017   LEFT TEMPLE CX3 5FU   Cancer (HCC)    Right breast cancer- s/p right mastectomy -19 yrs ago   Dry eyes, bilateral    Restasis use daily   GERD (gastroesophageal reflux disease)    H/O urinary frequency    Headache    migraines- much milder now   Sleep apnea    cpap bedtime- Dr. Roseann, PCP follows     Past Surgical History:  Procedure Laterality Date   APPENDECTOMY     laparoscopic   BREAST SURGERY      multiple needle biopsiesin past.   CATARACT EXTRACTION, BILATERAL     COLONOSCOPY WITH PROPOFOL  N/A 05/13/2016   Procedure: COLONOSCOPY WITH PROPOFOL ;  Surgeon: Gladis MARLA Louder, MD;  Location: [facility];  Service: Endoscopy;  Laterality: N/A;   MASTECTOMY Right    Mastectomy   RECONSTRUCTION BREAST W/ TRAM FLAP Right    Right breast tram flap   TONSILLECTOMY     child    History reviewed. No pertinent family history.   Social Connections: Not on file      Current Outpatient Medications:    atorvastatin (LIPITOR) 10 MG tablet, Take 10 mg by mouth at bedtime., Disp: , Rfl:  b complex vitamins tablet, Take 1 tablet by mouth 2 (two) times daily., Disp: , Rfl:    calcium-vitamin D (OSCAL WITH D) 500-200 MG-UNIT tablet, Take 1 tablet by mouth daily., Disp: , Rfl:    co-enzyme Q-10 30 MG capsule, Take 100 mg by mouth daily., Disp: , Rfl:    cyclobenzaprine (FLEXERIL) 10 MG tablet, Take 10 mg by mouth 3 (three) times daily as needed for muscle spasms., Disp: , Rfl:    cycloSPORINE (RESTASIS) 0.05 % ophthalmic  emulsion, Place 1 drop into both eyes 2 (two) times daily., Disp: , Rfl:    fluticasone (FLONASE) 50 MCG/ACT nasal spray, Place 2 sprays into both nostrils daily., Disp: 16 g, Rfl: 6   magnesium gluconate (MAGONATE) 500 MG tablet, Take 500 mg by mouth 2 (two) times daily., Disp: , Rfl:    sertraline (ZOLOFT) 25 MG tablet, Take 25 mg by mouth daily., Disp: , Rfl:    Vitamin D, Ergocalciferol, (DRISDOL) 50000 units CAPS capsule, Take 50,000 Units by mouth every 14 (fourteen) days. Tuesday( every other week), Disp: , Rfl:    magnesium oxide (MAG-OX) 400 MG tablet, Take 400 mg by mouth daily. (Patient not taking: Reported on 05/17/2024), Disp: , Rfl:    Physical Exam:   BP (!) 164/74 (BP Location: Right Arm, Patient Position: Sitting, Cuff Size: Large)   Pulse 68   Ht 5' 4 (1.626 m)   Wt 165 lb (74.8 kg)   SpO2 94%   BMI 28.32 kg/m   Salient findings:  CN II-XII intact Given history and complaints, ear microscopy was indicated and performed for evaluation with findings as below in physical exam section and in procedures; Bilateral EAC clear and TM intact with well pneumatized middle ear spaces -- no significant retraction; modest bilateral chronic eczematoid otitis externa changes bilateral EAC Weber 512: LEFT Rinne 512: AC > BC b/l Anterior rhinoscopy: Septum intact; bilateral inferior turbinates without significant hypertrophy No lesions of oral cavity/oropharynx No obviously palpable neck masses/lymphadenopathy/thyromegaly No respiratory distress or stridor  Seprately Identifiable Procedures:  Prior to initiating any procedures, risks/benefits/alternatives were explained to the patient and verbal consent obtained. Procedure: Bilateral ear microscopy using microscope (CPT [id number]) Pre-procedure diagnosis: mixed hearing loss bilaterally Post-procedure diagnosis: same Indication: see above; given patient's otologic complaints and history, for improved and comprehensive examination of  external ear and tympanic membrane, bilateral otologic examination using microscope was performed. Prior to proceeding, verbal consent was obtained after discussion of R/B/A  Procedure: Patient was placed semi-recumbent. Both ear canals were examined using the microscope with findings above. Patient tolerated the procedure well.   Impression & Plans:  Chantell Kunkler is a 78 y.o. female with:  1. Mixed hearing loss, bilateral   2. Nasal congestion   3. Dysfunction of both eustachian tubes   4. Chronic eczematous otitis externa of both ears    Noted mixed HL --- but otherwise no sx; no recent CT; we discussed options including possible conservative mgmt with ETD sprays and see how she does v/l CT temporal bone. She would prefer to avoid imaging currently; in interim, will start Flonase BID, insufflate ears, and regular nasal rinses.   Eczematoid OE:  Itching in both ears managed with hydrocortisone and mineral oil. No drainage, infection - Continue hydrocortisone for ear itching as needed.  F/u ~3 months with audio  See below regarding exact medications prescribed this encounter including dosages and route: Meds ordered this encounter  Medications   fluticasone (FLONASE) 50 MCG/ACT nasal spray  Sig: Place 2 sprays into both nostrils daily.    Dispense:  16 g    Refill:  6      Thank you for allowing me the opportunity to care for your patient. Please do not hesitate to contact me should you have any other questions.  Sincerely, Eldora Blanch, MD Otolaryngologist (ENT), [facility] Phone: [phone] Fax: [phone]  05/28/2024, 8:36 AM   MDM:  Level 4 - [phone] Complexity/Problems addressed: mod - multiple chronic issues Data complexity: mod - independent interpretation of outside audiogram - Morbidity: low  - Prescription Drug prescribed or managed: n

## 2024-05-24 DIAGNOSIS — G4733 Obstructive sleep apnea (adult) (pediatric): Secondary | ICD-10-CM | POA: Diagnosis not present

## 2024-05-31 DIAGNOSIS — M25561 Pain in right knee: Secondary | ICD-10-CM | POA: Diagnosis not present

## 2024-05-31 DIAGNOSIS — M25562 Pain in left knee: Secondary | ICD-10-CM | POA: Diagnosis not present

## 2024-05-31 DIAGNOSIS — M6281 Muscle weakness (generalized): Secondary | ICD-10-CM | POA: Diagnosis not present

## 2024-05-31 DIAGNOSIS — M542 Cervicalgia: Secondary | ICD-10-CM | POA: Diagnosis not present

## 2024-05-31 DIAGNOSIS — R2681 Unsteadiness on feet: Secondary | ICD-10-CM | POA: Diagnosis not present

## 2024-06-03 DIAGNOSIS — M542 Cervicalgia: Secondary | ICD-10-CM | POA: Diagnosis not present

## 2024-06-03 DIAGNOSIS — R2681 Unsteadiness on feet: Secondary | ICD-10-CM | POA: Diagnosis not present

## 2024-06-03 DIAGNOSIS — M25562 Pain in left knee: Secondary | ICD-10-CM | POA: Diagnosis not present

## 2024-06-03 DIAGNOSIS — M6281 Muscle weakness (generalized): Secondary | ICD-10-CM | POA: Diagnosis not present

## 2024-06-03 DIAGNOSIS — M25561 Pain in right knee: Secondary | ICD-10-CM | POA: Diagnosis not present

## 2024-06-06 DIAGNOSIS — M6281 Muscle weakness (generalized): Secondary | ICD-10-CM | POA: Diagnosis not present

## 2024-06-06 DIAGNOSIS — R2681 Unsteadiness on feet: Secondary | ICD-10-CM | POA: Diagnosis not present

## 2024-06-06 DIAGNOSIS — M25562 Pain in left knee: Secondary | ICD-10-CM | POA: Diagnosis not present

## 2024-06-06 DIAGNOSIS — M25561 Pain in right knee: Secondary | ICD-10-CM | POA: Diagnosis not present

## 2024-06-06 DIAGNOSIS — M542 Cervicalgia: Secondary | ICD-10-CM | POA: Diagnosis not present

## 2024-06-08 DIAGNOSIS — M25562 Pain in left knee: Secondary | ICD-10-CM | POA: Diagnosis not present

## 2024-06-08 DIAGNOSIS — M25561 Pain in right knee: Secondary | ICD-10-CM | POA: Diagnosis not present

## 2024-06-08 DIAGNOSIS — R2681 Unsteadiness on feet: Secondary | ICD-10-CM | POA: Diagnosis not present

## 2024-06-08 DIAGNOSIS — M542 Cervicalgia: Secondary | ICD-10-CM | POA: Diagnosis not present

## 2024-06-08 DIAGNOSIS — M6281 Muscle weakness (generalized): Secondary | ICD-10-CM | POA: Diagnosis not present

## 2024-06-12 DIAGNOSIS — E7800 Pure hypercholesterolemia, unspecified: Secondary | ICD-10-CM | POA: Diagnosis not present

## 2024-06-12 DIAGNOSIS — I10 Essential (primary) hypertension: Secondary | ICD-10-CM | POA: Diagnosis not present

## 2024-06-12 DIAGNOSIS — E663 Overweight: Secondary | ICD-10-CM | POA: Diagnosis not present

## 2024-06-12 DIAGNOSIS — F320 Major depressive disorder, single episode, mild: Secondary | ICD-10-CM | POA: Diagnosis not present

## 2024-06-13 DIAGNOSIS — Z1231 Encounter for screening mammogram for malignant neoplasm of breast: Secondary | ICD-10-CM | POA: Diagnosis not present

## 2024-06-14 ENCOUNTER — Other Ambulatory Visit

## 2024-06-14 DIAGNOSIS — Z006 Encounter for examination for normal comparison and control in clinical research program: Secondary | ICD-10-CM

## 2024-06-17 DIAGNOSIS — M25561 Pain in right knee: Secondary | ICD-10-CM | POA: Diagnosis not present

## 2024-06-17 DIAGNOSIS — M6281 Muscle weakness (generalized): Secondary | ICD-10-CM | POA: Diagnosis not present

## 2024-06-17 DIAGNOSIS — M542 Cervicalgia: Secondary | ICD-10-CM | POA: Diagnosis not present

## 2024-06-17 DIAGNOSIS — R2681 Unsteadiness on feet: Secondary | ICD-10-CM | POA: Diagnosis not present

## 2024-06-17 DIAGNOSIS — M25562 Pain in left knee: Secondary | ICD-10-CM | POA: Diagnosis not present

## 2024-06-22 DIAGNOSIS — M6281 Muscle weakness (generalized): Secondary | ICD-10-CM | POA: Diagnosis not present

## 2024-06-22 DIAGNOSIS — M25562 Pain in left knee: Secondary | ICD-10-CM | POA: Diagnosis not present

## 2024-06-22 DIAGNOSIS — R2681 Unsteadiness on feet: Secondary | ICD-10-CM | POA: Diagnosis not present

## 2024-06-22 DIAGNOSIS — M25561 Pain in right knee: Secondary | ICD-10-CM | POA: Diagnosis not present

## 2024-06-22 DIAGNOSIS — M542 Cervicalgia: Secondary | ICD-10-CM | POA: Diagnosis not present

## 2024-06-22 LAB — GENECONNECT MOLECULAR SCREEN: Genetic Analysis Overall Interpretation: NEGATIVE

## 2024-06-24 DIAGNOSIS — R2681 Unsteadiness on feet: Secondary | ICD-10-CM | POA: Diagnosis not present

## 2024-06-24 DIAGNOSIS — M25562 Pain in left knee: Secondary | ICD-10-CM | POA: Diagnosis not present

## 2024-06-24 DIAGNOSIS — M6281 Muscle weakness (generalized): Secondary | ICD-10-CM | POA: Diagnosis not present

## 2024-06-24 DIAGNOSIS — M25561 Pain in right knee: Secondary | ICD-10-CM | POA: Diagnosis not present

## 2024-06-24 DIAGNOSIS — M542 Cervicalgia: Secondary | ICD-10-CM | POA: Diagnosis not present

## 2024-06-28 DIAGNOSIS — Z961 Presence of intraocular lens: Secondary | ICD-10-CM | POA: Diagnosis not present

## 2024-06-28 DIAGNOSIS — H353132 Nonexudative age-related macular degeneration, bilateral, intermediate dry stage: Secondary | ICD-10-CM | POA: Diagnosis not present

## 2024-06-28 DIAGNOSIS — H04123 Dry eye syndrome of bilateral lacrimal glands: Secondary | ICD-10-CM | POA: Diagnosis not present

## 2024-06-28 DIAGNOSIS — H18413 Arcus senilis, bilateral: Secondary | ICD-10-CM | POA: Diagnosis not present

## 2024-08-05 ENCOUNTER — Other Ambulatory Visit (HOSPITAL_COMMUNITY): Payer: Self-pay | Admitting: Internal Medicine

## 2024-08-05 DIAGNOSIS — R9389 Abnormal findings on diagnostic imaging of other specified body structures: Secondary | ICD-10-CM

## 2024-08-05 DIAGNOSIS — R0602 Shortness of breath: Secondary | ICD-10-CM

## 2024-08-11 ENCOUNTER — Ambulatory Visit (HOSPITAL_COMMUNITY)

## 2024-08-19 ENCOUNTER — Ambulatory Visit (HOSPITAL_COMMUNITY): Admission: RE | Admit: 2024-08-19 | Source: Ambulatory Visit

## 2024-08-19 DIAGNOSIS — R0602 Shortness of breath: Secondary | ICD-10-CM

## 2024-08-19 DIAGNOSIS — R9389 Abnormal findings on diagnostic imaging of other specified body structures: Secondary | ICD-10-CM

## 2024-11-08 ENCOUNTER — Ambulatory Visit (INDEPENDENT_AMBULATORY_CARE_PROVIDER_SITE_OTHER): Admitting: Otolaryngology

## 2024-11-08 ENCOUNTER — Ambulatory Visit (INDEPENDENT_AMBULATORY_CARE_PROVIDER_SITE_OTHER): Admitting: Audiology
